# Patient Record
Sex: Female | Born: 1960
Health system: Southern US, Community
[De-identification: ages and names within clinical notes are randomized; demographics above are authoritative.]

---

## 1998-07-09 ENCOUNTER — Encounter: Payer: Self-pay | Admitting: Obstetrics and Gynecology

## 1998-07-09 ENCOUNTER — Ambulatory Visit (HOSPITAL_COMMUNITY): Admission: RE | Admit: 1998-07-09 | Discharge: 1998-07-09 | Payer: Self-pay | Admitting: Obstetrics and Gynecology

## 2008-02-20 ENCOUNTER — Encounter: Admission: RE | Admit: 2008-02-20 | Discharge: 2008-02-20 | Payer: Self-pay | Admitting: Obstetrics and Gynecology

## 2008-02-22 ENCOUNTER — Encounter (INDEPENDENT_AMBULATORY_CARE_PROVIDER_SITE_OTHER): Payer: Self-pay | Admitting: Diagnostic Radiology

## 2008-02-22 ENCOUNTER — Encounter: Admission: RE | Admit: 2008-02-22 | Discharge: 2008-02-22 | Payer: Self-pay | Admitting: Obstetrics and Gynecology

## 2008-02-28 ENCOUNTER — Encounter: Admission: RE | Admit: 2008-02-28 | Discharge: 2008-02-28 | Payer: Self-pay | Admitting: Obstetrics and Gynecology

## 2008-02-29 ENCOUNTER — Ambulatory Visit: Payer: Self-pay | Admitting: Oncology

## 2008-03-08 ENCOUNTER — Ambulatory Visit (HOSPITAL_COMMUNITY): Admission: RE | Admit: 2008-03-08 | Discharge: 2008-03-08 | Payer: Self-pay | Admitting: Oncology

## 2008-03-12 ENCOUNTER — Ambulatory Visit (HOSPITAL_COMMUNITY): Admission: RE | Admit: 2008-03-12 | Discharge: 2008-03-12 | Payer: Self-pay | Admitting: Oncology

## 2008-03-19 ENCOUNTER — Encounter: Admission: RE | Admit: 2008-03-19 | Discharge: 2008-03-19 | Payer: Self-pay | Admitting: Oncology

## 2009-03-08 IMAGING — CT CT ABDOMEN W/ CM
2 of 5 series · 16 of 46 positions shown, 18 images · IV contrast (agent unspecified)
Comparison: None.

CT CHEST

CLINICAL DATA: Aging new diagnosis of right breast cancer.  Chest
pain and cough.  Shortness breath.  Status post appendectomy.

CT CHEST, ABDOMEN AND PELVIS WITH CONTRAST
TECHNIQUE: Multidetector CT imaging of the chest, abdomen and
pelvis was performed following the standard protocol during bolus
administration of intravenous contrast.
Contrast: 100 ml Tmnipaque-VUU.

[Series 2: cap 5.0 b40f · axial · 0.62mm/px · z∈[-610,-50]mm · 13 of 128 slices shown, 15 images]
[im 8/128  soft-tissue]
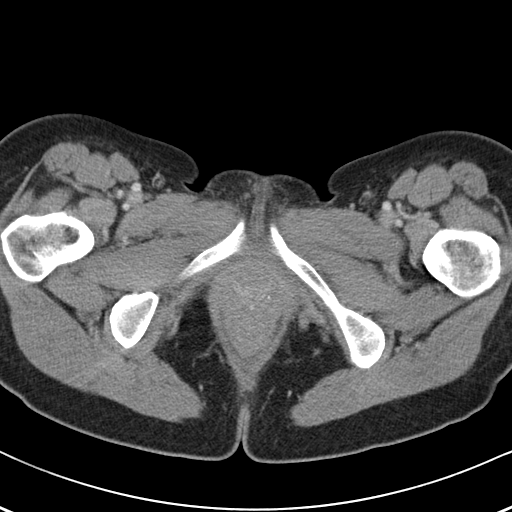
[im 8/128  bone]
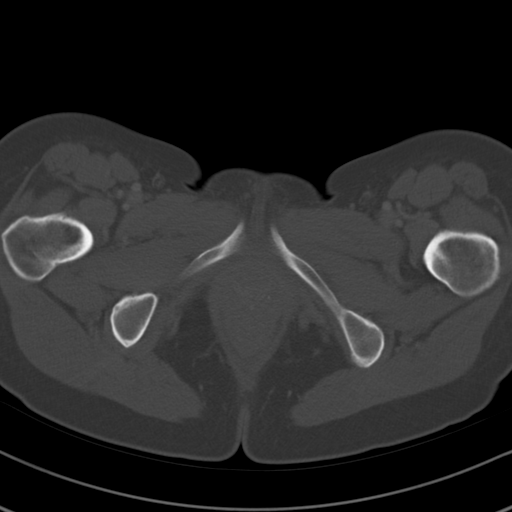
[im 15/128  soft-tissue]
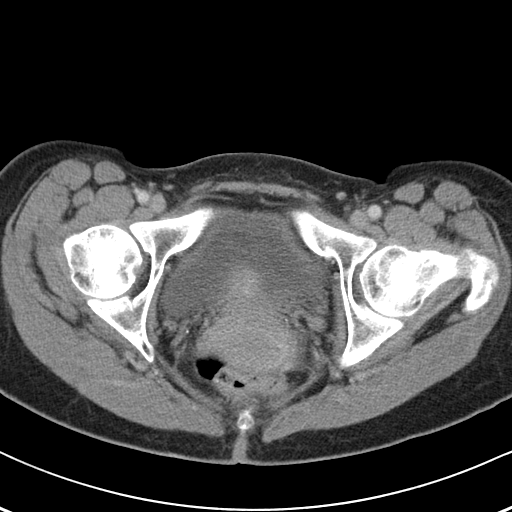
[im 30/128  soft-tissue]
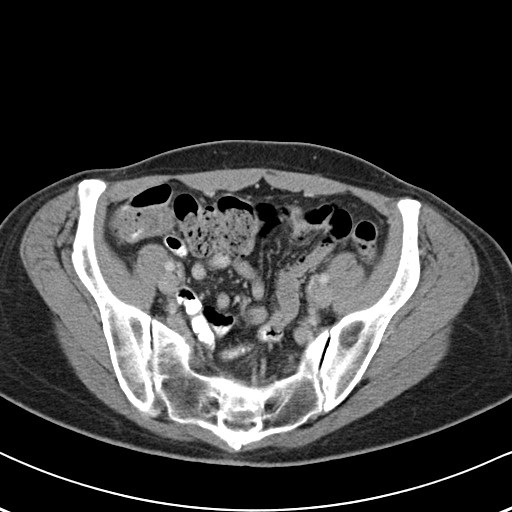
[im 38/128  soft-tissue]
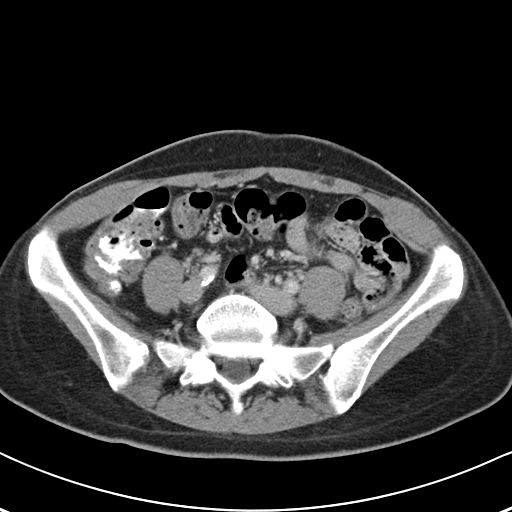
[im 45/128  soft-tissue]
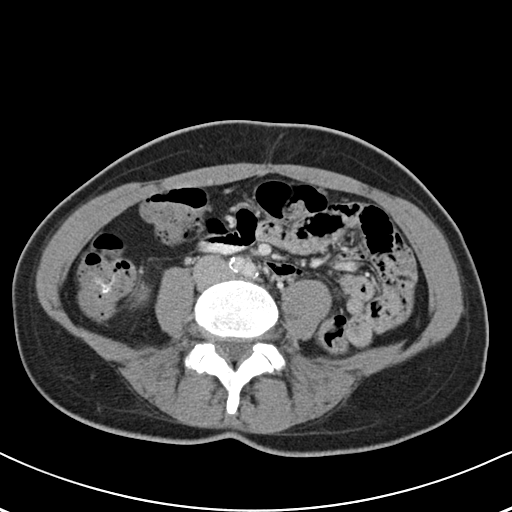
[im 53/128  soft-tissue]
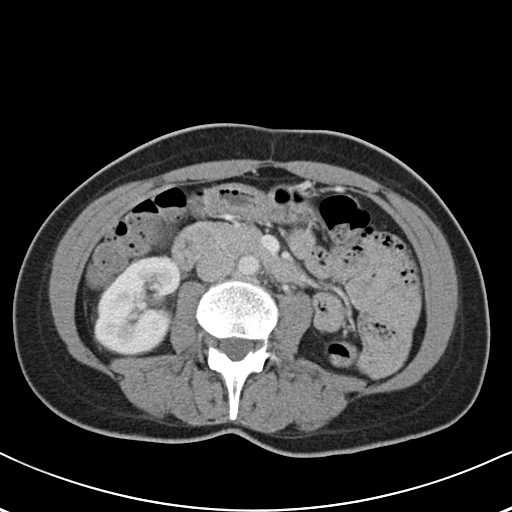
[im 68/128  soft-tissue]
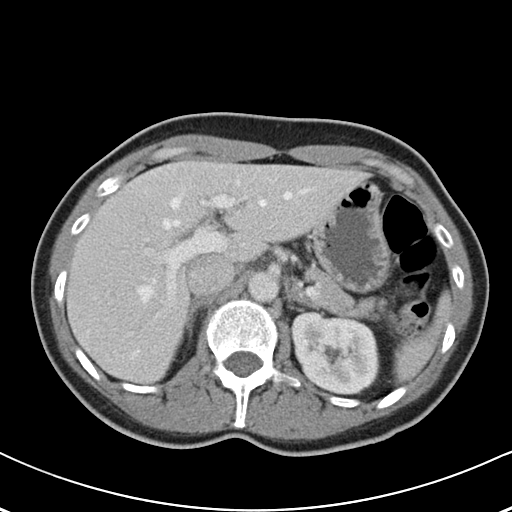
[im 75/128  soft-tissue]
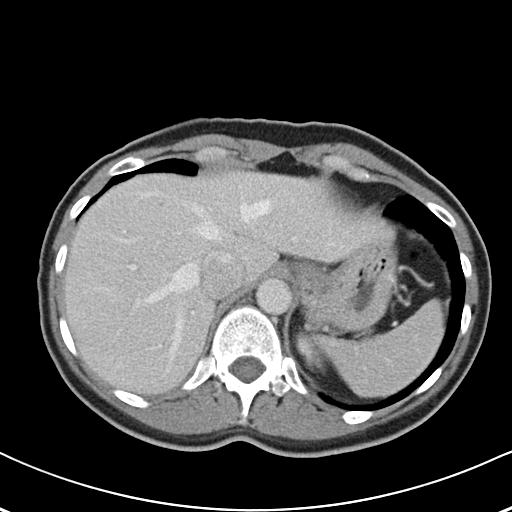
[im 83/128  soft-tissue]
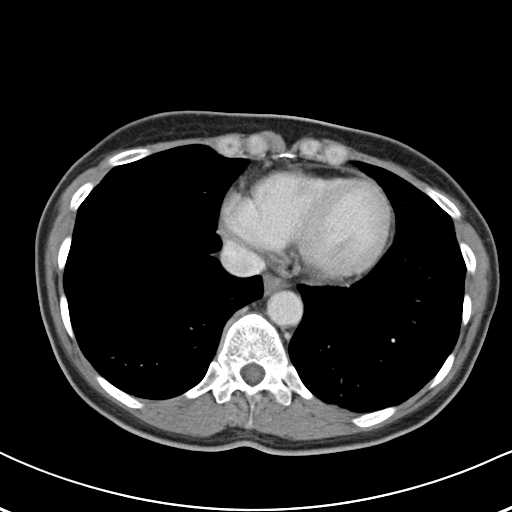
[im 83/128  bone]
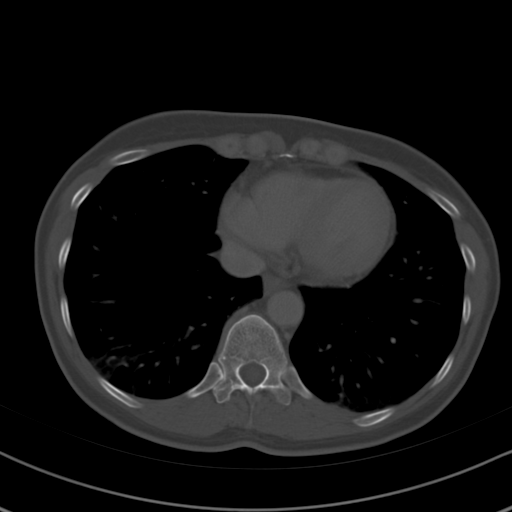
[im 90/128  soft-tissue]
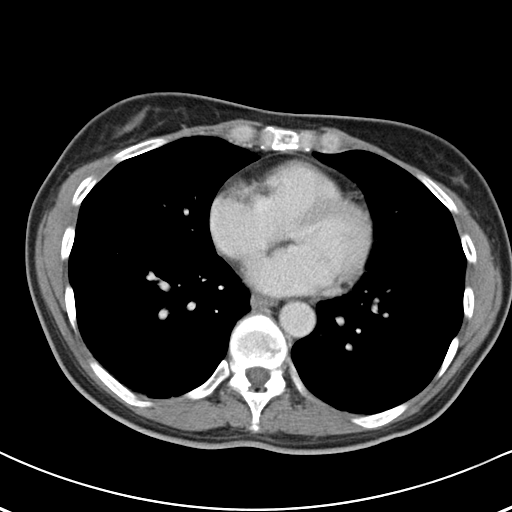
[im 98/128  soft-tissue]
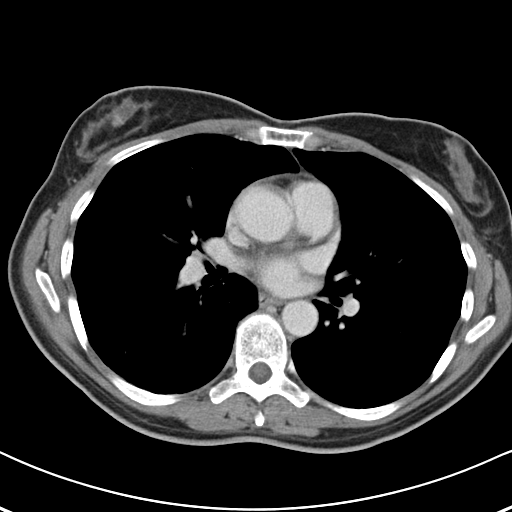
[im 113/128  soft-tissue]
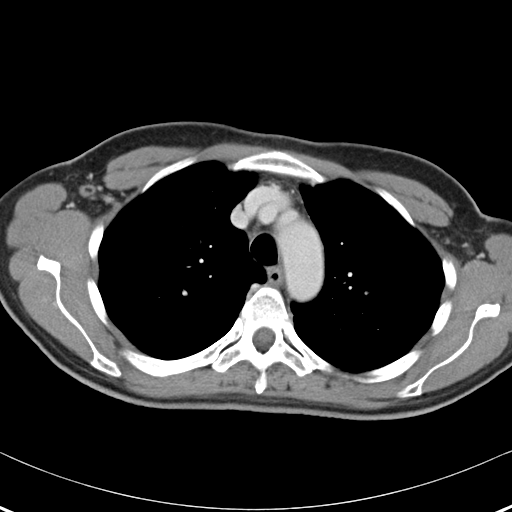
[im 120/128  soft-tissue]
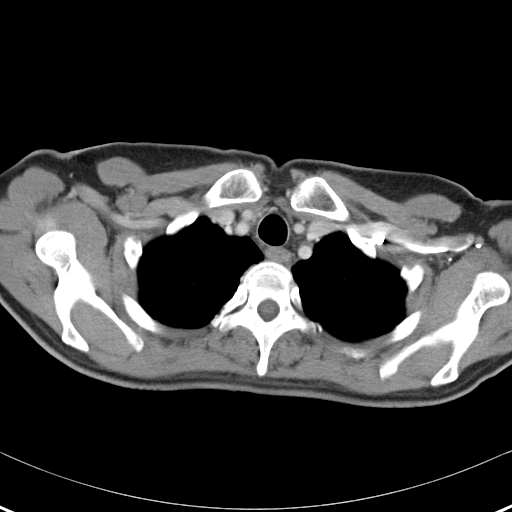

[Series 602: <mpr thick range> · coronal · 1.24mm/px · 3 of 72 slices shown]
[im 24/72  soft-tissue]
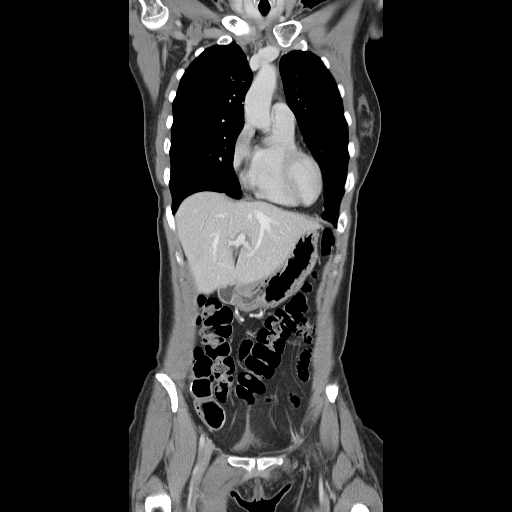
[im 32/72  soft-tissue]
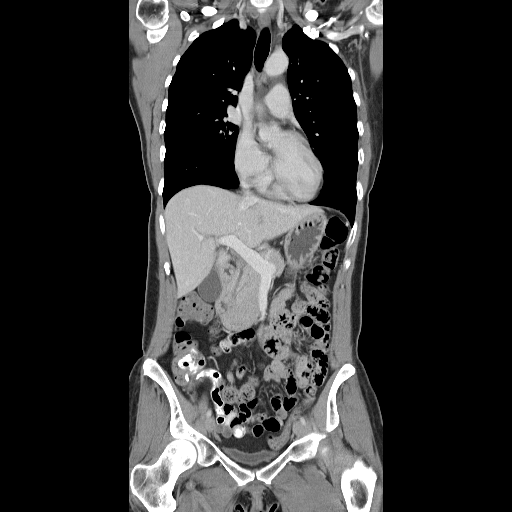
[im 40/72  soft-tissue]
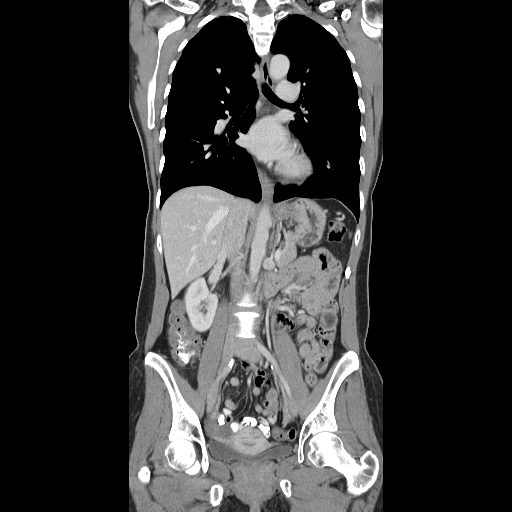

[16 of 46 positions shown; findings below may reference images not displayed]

FINDINGS: Bibasilar parenchymal changes have an appearance
suggestive of atelectasis.  Infectious infiltrate felt to be less
likely consideration. These areas can be further evaluated on
follow-up. Atelectatic changes inferior aspect right middle lobe.
Minimal apical pleural thickening right greater left without bony
destruction.  Tiny pleural based nodules right upper lobe (series 4
images 8 and 23), tiny opacity peripheral aspect right upper lobe
(series 4 image 29).  Attention to these regions on follow-up.

No pulmonary embolus.  Heart size within normal limits.  Dilated
ascending thoracic aorta measuring 3.5 x 3.5 cm as versus
descending aorta measuring 2.2 x 2.2 cm.  No aortic dissection.

No mediastinal, hilar or axillary adenopathy.  Right breast mass
measures up to 4 cm consistent with the patient's known malignancy.
Degenerative changes lower cervical thoracic spine No bony
destructive lesion.
IMPRESSION: 4 cm right breast mass consistent with the patient's known
malignancy.

No definitive evidence of pulmonary metastatic disease.  The tiny
nodules seen within the right upper lung  may be unrelated to
malignancy, stability can be confirmed on follow-up.

Bibasilar parenchymal opacities may represent atelectatic changes
rather than infiltrate.  Clinical correlation and follow-up
recommended if there are progressive symptoms such as cough or
chest pain.

Ascending thoracic aorta is dilated measuring up to 3.5 cm as noted
above.

CT ABDOMEN
FINDINGS: Within the liver, there are at least four low density
lesions measuring up to 4 mm best appreciated on sagittal imaging.
These are too small to adequately characterized as a small cysts
and therefore  small metastatic lesions cannot be completely
excluded.  Stability will need to be confirmed on follow-up.

Bilateral adrenal gland hyperplasia.  The enlarged adrenal glands
do not demonstrate abnormal F D G uptake on the accompanying PET
CT.

No focal splenic, renal or pancreatic lesion.  No calcified
gallstones.  Mild calcification and plaque formation lower
abdominal aorta without aneurysmal dilatation.  The no
retroperitoneal or pelvic adenopathy.  No abnormal inflammatory
process or focal bowel abnormality.  No bony destructive lesion.
IMPRESSION: Tiny liver lesions too small to adequately characterize as
discussed above.

Bilateral adrenal gland hyperplasia.

CT PELVIS
FINDINGS: Within the right aspect of pelvis, fluid containing
structure may represent small bowel however hydrosalpinx cannot be
excluded.  No pelvic adenopathy or findings to suggest metastatic
disease.
IMPRESSION: Question of right-sided hydrosalpinx as noted above.

No pelvic metastatic disease.

## 2011-04-14 ENCOUNTER — Other Ambulatory Visit: Payer: Self-pay | Admitting: Plastic Surgery

## 2011-05-15 LAB — GLUCOSE, CAPILLARY: Glucose-Capillary: 95

## 2011-09-29 ENCOUNTER — Other Ambulatory Visit: Payer: Self-pay | Admitting: Plastic Surgery

## 2020-03-16 ENCOUNTER — Other Ambulatory Visit: Payer: Self-pay

## 2020-03-16 ENCOUNTER — Encounter (HOSPITAL_COMMUNITY): Payer: Self-pay | Admitting: Radiology

## 2020-03-16 ENCOUNTER — Emergency Department (HOSPITAL_COMMUNITY): Payer: No Typology Code available for payment source

## 2020-03-16 ENCOUNTER — Inpatient Hospital Stay (HOSPITAL_COMMUNITY)
Admission: EM | Admit: 2020-03-16 | Discharge: 2020-03-19 | DRG: 024 | Disposition: A | Discharge status: Home / Self Care | Payer: No Typology Code available for payment source | Attending: Neurology | Admitting: Neurology

## 2020-03-16 ENCOUNTER — Encounter (HOSPITAL_COMMUNITY)
Admission: EM | Disposition: A | Discharge status: Home / Self Care | Payer: Self-pay | Source: Home / Self Care | Attending: Neurology

## 2020-03-16 ENCOUNTER — Inpatient Hospital Stay (HOSPITAL_COMMUNITY): Payer: No Typology Code available for payment source

## 2020-03-16 ENCOUNTER — Emergency Department (HOSPITAL_COMMUNITY): Payer: No Typology Code available for payment source | Admitting: Anesthesiology

## 2020-03-16 DIAGNOSIS — R471 Dysarthria and anarthria: Secondary | ICD-10-CM | POA: Diagnosis present

## 2020-03-16 DIAGNOSIS — Z91040 Latex allergy status: Secondary | ICD-10-CM

## 2020-03-16 DIAGNOSIS — I639 Cerebral infarction, unspecified: Secondary | ICD-10-CM | POA: Diagnosis present

## 2020-03-16 DIAGNOSIS — I63119 Cerebral infarction due to embolism of unspecified vertebral artery: Secondary | ICD-10-CM

## 2020-03-16 DIAGNOSIS — Q211 Atrial septal defect: Secondary | ICD-10-CM

## 2020-03-16 DIAGNOSIS — Z23 Encounter for immunization: Secondary | ICD-10-CM

## 2020-03-16 DIAGNOSIS — R29810 Facial weakness: Secondary | ICD-10-CM | POA: Diagnosis present

## 2020-03-16 DIAGNOSIS — R414 Neurologic neglect syndrome: Secondary | ICD-10-CM | POA: Diagnosis present

## 2020-03-16 DIAGNOSIS — Z885 Allergy status to narcotic agent status: Secondary | ICD-10-CM | POA: Diagnosis not present

## 2020-03-16 DIAGNOSIS — Z9011 Acquired absence of right breast and nipple: Secondary | ICD-10-CM | POA: Diagnosis not present

## 2020-03-16 DIAGNOSIS — Q2112 Patent foramen ovale: Secondary | ICD-10-CM

## 2020-03-16 DIAGNOSIS — R001 Bradycardia, unspecified: Secondary | ICD-10-CM | POA: Diagnosis not present

## 2020-03-16 DIAGNOSIS — I959 Hypotension, unspecified: Secondary | ICD-10-CM | POA: Diagnosis present

## 2020-03-16 DIAGNOSIS — I63411 Cerebral infarction due to embolism of right middle cerebral artery: Principal | ICD-10-CM | POA: Diagnosis present

## 2020-03-16 DIAGNOSIS — Z20822 Contact with and (suspected) exposure to covid-19: Secondary | ICD-10-CM | POA: Diagnosis present

## 2020-03-16 DIAGNOSIS — E785 Hyperlipidemia, unspecified: Secondary | ICD-10-CM | POA: Diagnosis present

## 2020-03-16 DIAGNOSIS — Z853 Personal history of malignant neoplasm of breast: Secondary | ICD-10-CM

## 2020-03-16 DIAGNOSIS — K047 Periapical abscess without sinus: Secondary | ICD-10-CM | POA: Diagnosis present

## 2020-03-16 DIAGNOSIS — Z8673 Personal history of transient ischemic attack (TIA), and cerebral infarction without residual deficits: Secondary | ICD-10-CM

## 2020-03-16 DIAGNOSIS — G8194 Hemiplegia, unspecified affecting left nondominant side: Secondary | ICD-10-CM | POA: Diagnosis present

## 2020-03-16 DIAGNOSIS — I6601 Occlusion and stenosis of right middle cerebral artery: Secondary | ICD-10-CM

## 2020-03-16 HISTORY — PX: RADIOLOGY WITH ANESTHESIA: SHX6223

## 2020-03-16 HISTORY — PX: IR PERCUTANEOUS ART THROMBECTOMY/INFUSION INTRACRANIAL INC DIAG ANGIO: IMG6087

## 2020-03-16 LAB — COMPREHENSIVE METABOLIC PANEL
ALT: 19 U/L (ref 0–44)
AST: 17 U/L (ref 15–41)
Albumin: 3.4 g/dL — ABNORMAL LOW (ref 3.5–5.0)
Alkaline Phosphatase: 59 U/L (ref 38–126)
Anion gap: 10 (ref 5–15)
BUN: 8 mg/dL (ref 6–20)
CO2: 24 mmol/L (ref 22–32)
Calcium: 9.4 mg/dL (ref 8.9–10.3)
Chloride: 106 mmol/L (ref 98–111)
Creatinine, Ser: 0.72 mg/dL (ref 0.44–1.00)
GFR calc Af Amer: >60 mL/min (ref >60)
GFR calc non Af Amer: >60 mL/min (ref >60)
Glucose, Bld: 94 mg/dL (ref 70–99)
Potassium: 4.1 mmol/L (ref 3.5–5.1)
Sodium: 140 mmol/L (ref 135–145)
Total Bilirubin: 0.7 mg/dL (ref 0.3–1.2)
Total Protein: 5.7 g/dL — ABNORMAL LOW (ref 6.5–8.1)

## 2020-03-16 LAB — I-STAT BETA HCG BLOOD, ED (MC, WL, AP ONLY): I-stat hCG, quantitative: <5 m[IU]/mL (ref <5)

## 2020-03-16 LAB — RESPIRATORY PANEL BY RT PCR (FLU A&B, COVID)
Influenza A by PCR: NEGATIVE
Influenza B by PCR: NEGATIVE
SARS Coronavirus 2 by RT PCR: NEGATIVE

## 2020-03-16 LAB — CBG MONITORING, ED: Glucose-Capillary: 88 mg/dL (ref 70–99)

## 2020-03-16 LAB — I-STAT CHEM 8, ED
BUN: 8 mg/dL (ref 6–20)
Calcium, Ion: 1.15 mmol/L (ref 1.15–1.40)
Chloride: 104 mmol/L (ref 98–111)
Creatinine, Ser: 0.6 mg/dL (ref 0.44–1.00)
Glucose, Bld: 89 mg/dL (ref 70–99)
HCT: 37 % (ref 36.0–46.0)
Hemoglobin: 12.6 g/dL (ref 12.0–15.0)
Potassium: 3.9 mmol/L (ref 3.5–5.1)
Sodium: 141 mmol/L (ref 135–145)
TCO2: 24 mmol/L (ref 22–32)

## 2020-03-16 LAB — CBC
HCT: 38.6 % (ref 36.0–46.0)
Hemoglobin: 12.6 g/dL (ref 12.0–15.0)
MCH: 31.6 pg (ref 26.0–34.0)
MCHC: 32.6 g/dL (ref 30.0–36.0)
MCV: 96.7 fL (ref 80.0–100.0)
Platelets: 244 10*3/uL (ref 150–400)
RBC: 3.99 MIL/uL (ref 3.87–5.11)
RDW: 12 % (ref 11.5–15.5)
WBC: 6.3 10*3/uL (ref 4.0–10.5)
nRBC: 0 % (ref 0.0–0.2)

## 2020-03-16 LAB — DIFFERENTIAL
Abs Immature Granulocytes: 0.01 10*3/uL (ref 0.00–0.07)
Basophils Absolute: 0.1 10*3/uL (ref 0.0–0.1)
Basophils Relative: 1 %
Eosinophils Absolute: 0.1 10*3/uL (ref 0.0–0.5)
Eosinophils Relative: 1 %
Immature Granulocytes: 0 %
Lymphocytes Relative: 33 %
Lymphs Abs: 2.1 10*3/uL (ref 0.7–4.0)
Monocytes Absolute: 0.4 10*3/uL (ref 0.1–1.0)
Monocytes Relative: 6 %
Neutro Abs: 3.7 10*3/uL (ref 1.7–7.7)
Neutrophils Relative %: 59 %

## 2020-03-16 LAB — MRSA PCR SCREENING: MRSA by PCR: NEGATIVE

## 2020-03-16 LAB — APTT: aPTT: 27 seconds (ref 24–36)

## 2020-03-16 LAB — ANTITHROMBIN III: AntiThromb III Func: 90 % (ref 75–120)

## 2020-03-16 LAB — PROTIME-INR
INR: 1 (ref 0.8–1.2)
Prothrombin Time: 12.6 seconds (ref 11.4–15.2)

## 2020-03-16 LAB — ETHANOL: Alcohol, Ethyl (B): <10 mg/dL (ref <10)

## 2020-03-16 LAB — HIV ANTIBODY (ROUTINE TESTING W REFLEX): HIV Screen 4th Generation wRfx: NONREACTIVE

## 2020-03-16 LAB — VITAMIN B12: Vitamin B-12: 382 pg/mL (ref 180–914)

## 2020-03-16 SURGERY — IR WITH ANESTHESIA
Anesthesia: General

## 2020-03-16 MED ORDER — SENNOSIDES-DOCUSATE SODIUM 8.6-50 MG PO TABS: 1.0000 | ORAL_TABLET | Freq: Every evening | ORAL | Status: DC | PRN

## 2020-03-16 MED ORDER — TIROFIBAN HCL IN NACL 5-0.9 MG/100ML-% IV SOLN
INTRAVENOUS | Status: AC
  Filled 2020-03-16: qty 100

## 2020-03-16 MED ORDER — SODIUM CHLORIDE 0.9 % IV SOLN: 50.0000 mL | Freq: Once | INTRAVENOUS | Status: DC

## 2020-03-16 MED ORDER — EPTIFIBATIDE 20 MG/10ML IV SOLN
INTRAVENOUS | Status: AC
  Filled 2020-03-16: qty 10

## 2020-03-16 MED ORDER — IOHEXOL 240 MG/ML SOLN
INTRAMUSCULAR | Status: AC
  Filled 2020-03-16: qty 200

## 2020-03-16 MED ORDER — SUCCINYLCHOLINE CHLORIDE 200 MG/10ML IV SOSY
PREFILLED_SYRINGE | INTRAVENOUS | Status: DC | PRN
  Administered 2020-03-16: 80 mg via INTRAVENOUS

## 2020-03-16 MED ORDER — ASPIRIN 81 MG PO CHEW
CHEWABLE_TABLET | ORAL | Status: AC
  Filled 2020-03-16: qty 1

## 2020-03-16 MED ORDER — IOHEXOL 300 MG/ML  SOLN
150.0000 mL | Freq: Once | INTRAMUSCULAR | Status: AC | PRN
  Administered 2020-03-16: 85 mL via INTRA_ARTERIAL

## 2020-03-16 MED ORDER — SODIUM CHLORIDE 0.9 % IV BOLUS
1000.0000 mL | Freq: Once | INTRAVENOUS | Status: AC
  Administered 2020-03-16: 1000 mL via INTRAVENOUS

## 2020-03-16 MED ORDER — SODIUM CHLORIDE 0.9 % IV SOLN: 250.0000 mL | INTRAVENOUS | Status: DC

## 2020-03-16 MED ORDER — PROPOFOL 10 MG/ML IV BOLUS
INTRAVENOUS | Status: DC | PRN
  Administered 2020-03-16: 150 mg via INTRAVENOUS

## 2020-03-16 MED ORDER — ACETAMINOPHEN 650 MG RE SUPP: 650.0000 mg | RECTAL | Status: DC | PRN

## 2020-03-16 MED ORDER — IOHEXOL 350 MG/ML SOLN
50.0000 mL | Freq: Once | INTRAVENOUS | Status: AC | PRN
  Administered 2020-03-16: 50 mL via INTRAVENOUS

## 2020-03-16 MED ORDER — ACETAMINOPHEN 160 MG/5ML PO SOLN: 650.0000 mg | ORAL | Status: DC | PRN

## 2020-03-16 MED ORDER — TICAGRELOR 90 MG PO TABS
ORAL_TABLET | ORAL | Status: AC
  Filled 2020-03-16: qty 2

## 2020-03-16 MED ORDER — NITROGLYCERIN 1 MG/10 ML FOR IR/CATH LAB
INTRA_ARTERIAL | Status: AC
  Filled 2020-03-16: qty 10

## 2020-03-16 MED ORDER — SODIUM CHLORIDE 0.9 % IV BOLUS: 1000.0000 mL | Freq: Once | INTRAVENOUS | Status: AC

## 2020-03-16 MED ORDER — PNEUMOCOCCAL VAC POLYVALENT 25 MCG/0.5ML IJ INJ: 0.5000 mL | INJECTION | INTRAMUSCULAR | Status: DC | PRN

## 2020-03-16 MED ORDER — PHENYLEPHRINE HCL-NACL 10-0.9 MG/250ML-% IV SOLN
INTRAVENOUS | Status: DC | PRN
  Administered 2020-03-16: 50 ug/min via INTRAVENOUS

## 2020-03-16 MED ORDER — ACETAMINOPHEN 325 MG PO TABS: 650.0000 mg | ORAL_TABLET | ORAL | Status: DC | PRN

## 2020-03-16 MED ORDER — SODIUM CHLORIDE 0.9 % IV SOLN: INTRAVENOUS | Status: DC

## 2020-03-16 MED ORDER — CEFAZOLIN SODIUM-DEXTROSE 2-3 GM-%(50ML) IV SOLR
INTRAVENOUS | Status: DC | PRN
  Administered 2020-03-16: 2 g via INTRAVENOUS

## 2020-03-16 MED ORDER — CHLORHEXIDINE GLUCONATE CLOTH 2 % EX PADS
6.0000 | MEDICATED_PAD | Freq: Every day | CUTANEOUS | Status: DC
  Administered 2020-03-16 – 2020-03-17 (×2): 6 via TOPICAL

## 2020-03-16 MED ORDER — CLOPIDOGREL BISULFATE 300 MG PO TABS
ORAL_TABLET | ORAL | Status: AC
  Filled 2020-03-16: qty 1

## 2020-03-16 MED ORDER — CLEVIDIPINE BUTYRATE 0.5 MG/ML IV EMUL: 0.0000 mg/h | INTRAVENOUS | Status: DC

## 2020-03-16 MED ORDER — FENTANYL CITRATE (PF) 100 MCG/2ML IJ SOLN
INTRAMUSCULAR | Status: DC | PRN
  Administered 2020-03-16: 50 ug via INTRAVENOUS

## 2020-03-16 MED ORDER — ACETAMINOPHEN 325 MG PO TABS
650.0000 mg | ORAL_TABLET | ORAL | Status: DC | PRN
  Administered 2020-03-18: 650 mg via ORAL
  Filled 2020-03-16: qty 2

## 2020-03-16 MED ORDER — FENTANYL CITRATE (PF) 250 MCG/5ML IJ SOLN
INTRAMUSCULAR | Status: AC
  Filled 2020-03-16: qty 5

## 2020-03-16 MED ORDER — CLEVIDIPINE BUTYRATE 0.5 MG/ML IV EMUL: 0.0000 mg/h | INTRAVENOUS | Status: AC

## 2020-03-16 MED ORDER — PANTOPRAZOLE SODIUM 40 MG IV SOLR
40.0000 mg | Freq: Every day | INTRAVENOUS | Status: DC
  Administered 2020-03-17 – 2020-03-18 (×3): 40 mg via INTRAVENOUS
  Filled 2020-03-16 (×3): qty 40

## 2020-03-16 MED ORDER — ALTEPLASE (STROKE) FULL DOSE INFUSION
0.9000 mg/kg | Freq: Once | INTRAVENOUS | Status: AC
  Administered 2020-03-16: 40.8 mg via INTRAVENOUS
  Filled 2020-03-16: qty 100

## 2020-03-16 MED ORDER — STROKE: EARLY STAGES OF RECOVERY BOOK
Freq: Once | Status: DC
  Filled 2020-03-16: qty 1

## 2020-03-16 MED ORDER — CEFAZOLIN SODIUM-DEXTROSE 2-4 GM/100ML-% IV SOLN
INTRAVENOUS | Status: AC
  Filled 2020-03-16: qty 100

## 2020-03-16 MED ORDER — ONDANSETRON HCL 4 MG/2ML IJ SOLN
INTRAMUSCULAR | Status: DC | PRN
  Administered 2020-03-16: 4 mg via INTRAVENOUS

## 2020-03-16 MED ORDER — PHENYLEPHRINE HCL-NACL 10-0.9 MG/250ML-% IV SOLN
25.0000 ug/min | INTRAVENOUS | Status: DC
  Administered 2020-03-16: 50 ug/min via INTRAVENOUS
  Administered 2020-03-16: 200 ug/min via INTRAVENOUS
  Administered 2020-03-16: 65 ug/min via INTRAVENOUS
  Administered 2020-03-17 (×2): 100 ug/min via INTRAVENOUS
  Administered 2020-03-17: 50 ug/min via INTRAVENOUS
  Administered 2020-03-17: 30 ug/min via INTRAVENOUS
  Administered 2020-03-17 (×2): 100 ug/min via INTRAVENOUS
  Administered 2020-03-17: 30 ug/min via INTRAVENOUS
  Administered 2020-03-18: 25 ug/min via INTRAVENOUS
  Filled 2020-03-16: qty 500
  Filled 2020-03-16 (×3): qty 250
  Filled 2020-03-16 (×2): qty 500
  Filled 2020-03-16: qty 250

## 2020-03-16 MED ORDER — SODIUM CHLORIDE (PF) 0.9 % IJ SOLN
INTRAVENOUS | Status: AC | PRN
  Administered 2020-03-16: 25 ug via INTRA_ARTERIAL

## 2020-03-16 MED ORDER — ROCURONIUM BROMIDE 10 MG/ML (PF) SYRINGE
PREFILLED_SYRINGE | INTRAVENOUS | Status: DC | PRN
  Administered 2020-03-16: 10 mg via INTRAVENOUS
  Administered 2020-03-16: 30 mg via INTRAVENOUS

## 2020-03-16 MED ORDER — LIDOCAINE 2% (20 MG/ML) 5 ML SYRINGE
INTRAMUSCULAR | Status: DC | PRN
  Administered 2020-03-16: 60 mg via INTRAVENOUS

## 2020-03-16 MED ORDER — SODIUM CHLORIDE 0.9 % IV SOLN
INTRAVENOUS | Status: DC | PRN
Start: 2020-03-16 — End: 2020-03-16

## 2020-03-16 MED ORDER — VERAPAMIL HCL 2.5 MG/ML IV SOLN
INTRAVENOUS | Status: AC
  Filled 2020-03-16: qty 2

## 2020-03-16 MED ORDER — SUGAMMADEX SODIUM 200 MG/2ML IV SOLN
INTRAVENOUS | Status: DC | PRN
  Administered 2020-03-16: 200 mg via INTRAVENOUS

## 2020-03-16 NOTE — Anesthesia Postprocedure Evaluation (Signed)
Anesthesia Post Note  Patient: Chelsea Trevino  Procedure(s) Performed: IR WITH ANESTHESIA (N/A )     Patient location during evaluation: PACU Anesthesia Type: General Level of consciousness: awake and alert Pain management: pain level controlled Vital Signs Assessment: post-procedure vital signs reviewed and stable Respiratory status: spontaneous breathing, nonlabored ventilation, respiratory function stable and patient connected to nasal cannula oxygen Cardiovascular status: blood pressure returned to baseline and stable Postop Assessment: no apparent nausea or vomiting Anesthetic complications: no   No complications documented.  Last Vitals:  Vitals:   03/16/20 2045 03/16/20 2100  BP: 124/70 119/65  Pulse: (!) 44 (!) 43  Resp: 16 14  Temp:    SpO2: 95% 95%    Last Pain:  Vitals:   03/16/20 2000  TempSrc: Oral  PainSc:                  [name]

## 2020-03-16 NOTE — ED Notes (Signed)
Pts boss, Corrie Dandy, would like to be updated if pt consents [phone].

## 2020-03-16 NOTE — ED Notes (Signed)
Hand off report given to IR staff, care transitioned

## 2020-03-16 NOTE — ED Triage Notes (Signed)
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 Also, L sided facial droop, slurred speech. EMS arrived, activated code stroke, 110/70BP, 77HR, CBG 110. ED CBG 88.   H/o BCA with biopsy, denies blood thinners.

## 2020-03-16 NOTE — [facility] Notes (Signed)
Patient ID: Chelsea Trevino, female   DOB: [birth year], 59 y.o.   MRN: [id number]. INR 59 year old right-handed lady last seen normal at 11:30 AM today.  New onset of left-sided weakness left-sided, left-sided sensory symptoms,, and neglect on the left side.    MRSS 0.  CT of the brain demonstrates hyperdense right middle several artery sign.  Aspects 10.  No intracranial hemorrhage seen.  CT angiogram demonstrates occlusion of the right noticeable artery proximally, and possible clot in the origin of the right anterior cerebral A1 segment.  An emergent two-physician consent was obtained in view of the patient being unable to provide informed consent due to her medical condition..  Also no immediatel available family members available physically or by phone.  S.Opha Mcghee MD

## 2020-03-16 NOTE — [facility] Notes (Signed)
Unable to assess NIH - patient under Anesthesia care intubated and sedated

## 2020-03-16 NOTE — H&P (Signed)
Admission H&P    Chief Complaint: Acute onset of staggering gait  HPI: Chelsea Trevino is an 59 y.o. female who presented to the ED via EMS for assessment of acute onset left sided weakness. LKN was 1130 when she was out shopping. She states that after leaving a store, she started to feel dizzy and then tried to walk back in, but noticed that her gait was unsteady and staggering. Bystanders helped her to sit down, then noted that she was unable to stay sitting up due to left sided weakness. She also had left sided facial droop and slurred speech.  EMS was called to the scene, where they noted severe left sided weakness, dysarthria and left facial droop. A Code Stroke was called and she was emergently transported to the [facility] ED. Vitals per EMS: BP 110/70, HR 77, CBG 102.   The patient was unaware of her weakness on arrival. She endorses feeling dizzy when she was found staggering at the store, but is not able to specify what type of dizziness it was. The patient denies any neurological symptoms, including no headache, no weakness (she states this while clearly being plegic on the left), no sensory loss, no trouble talking/understanding speech, no paresthesias and no vision changes. She states that she has a PMHx of breast CA treated about 10 years ago, as well as a cyst "on the roof of my mouth" which was recently excised.   STAT CT head was obtained, revealing a dense right MCA sign, but no other acute abnormality.   CT head: 1. Brain appears normal at this time. However, there is definite long segment hyperdense right MCA consistent with embolic occlusion. 2. ASPECTS is 10  CTA of head and neck:  Embolic occlusion at the right carotid terminus. Right MCA occlusion. Severe stenosis of the right A1 segment but with distal flow. Markedly diminished flow evident throughout the right middle cerebral artery territory. No evidence of hemorrhage or swelling at this point in time. Carotid bifurcations  widely patent.  LSN: [id number] tPA Given: Yes  No past medical history on file.  No family history on file. Social History:  has no history on file for tobacco use, alcohol use, and drug use.  Allergies: Not on File  (Not in a hospital admission)   ROS: As per HPI. In the context of her anosognosia, comprehensive ROS is otherwise negative.   Physical Examination: There were no vitals taken for this visit.   HEENT-  [place]/AT  Heart: RRR Lungs - Respirations unlabored Abdomen: Nondistended Extremities - No edema  Neurologic Examination: Ment: Awake with mildly decreased level of alertness. Speech dysarthric but fluent with intact comprehension for basic questions and commands. Oriented to city, state, year, month and day.  CN: PERRL. Does not react to left sided visual stimuli. Rightward gaze deviation; patient unable to volitionally cross to the left, but can be overcome with oculocephalic maneuver. Does not react to left side facial stimuli. Left facial droop is prominent. Hearing intact to voice. Hypophonic speech. Head preferentially rotated to the right. Tongue protrudes slightly to the left of midline  Motor: 5/5 RUE and RLE. 0/5 LUE. 3/5 LLE which elevates to command after a significant delay.  Sensory: Absent FT, temp and pressure sensation to LUE. Decreased but present sensation to LLE. Sensation intact to RUE and RLE.  Reflexes: 2+ right biceps and brachioradialis. 1+ left biceps and brachioradialis. 2+ bilateral patellae Toes downgoing bilaterally.  Cerebellar: No ataxia with FNF on the right. Unable to  perform on the left.  Gait: Unable to assess.   Results for orders placed or performed during the hospital encounter of 03/16/20 (from the past 48 hour(s))  CBG monitoring, ED     Status: None   Collection Time: 03/16/20 12:08 PM  Result Value Ref Range   Glucose-Capillary 88 70 - 99 mg/dL    Comment: Glucose reference range applies only to samples taken after fasting for at  least 8 hours.   Comment 1 Notify RN   I-stat chem 8, ED     Status: None   Collection Time: 03/16/20 12:13 PM  Result Value Ref Range   Sodium 141 135 - 145 mmol/L   Potassium 3.9 3.5 - 5.1 mmol/L   Chloride 104 98 - 111 mmol/L   BUN 8 6 - 20 mg/dL   Creatinine, Ser 4.09 0.44 - 1.00 mg/dL   Glucose, Bld 89 70 - 99 mg/dL    Comment: Glucose reference range applies only to samples taken after fasting for at least 8 hours.   Calcium, Ion 1.15 1.15 - 1.40 mmol/L   TCO2 24 22 - 32 mmol/L   Hemoglobin 12.6 12.0 - 15.0 g/dL   HCT 81.1 36 - 46 %     Assessment: 59 y.o. female presenting with acute onset of left sided weakness, left facial droop, dysarthria and anosognosia.  1. Exam reveals findings referable to probable large right cerebral hemispheric stroke 2. STAT CT head: No acute hemorrhage or hypodensity. Hyperdense right MCA sign is noted.  3. CTA of head and neck: Embolic occlusion at the right carotid terminus. Right MCA occlusion. Severe stenosis of the right A1 segment but with distal flow. Markedly diminished flow evident throughout the right middle cerebral artery territory. Carotid bifurcations widely patent.. 4. Stroke Risk Factors - None 5. After comprehensive review of possible contraindications, she has no absolute contraindications to tPA administration. Patient is a tPA candidate. Discussed extensively the risks/benefits of tPA treatment vs. no treatment with the patient, including risks of hemorrhage and death with tPA administration versus worse overall outcomes on average in patients within tPA time window who are not administered tPA. Overall benefits of tPA regarding long-term prognosis are felt to outweigh risks. The patient expressed understanding and wish to proceed with tPA. tPA infusion started, without significant improvement in the patient's neurological exam.  6. The patient is a VIR candidate. Risks/benefits of the procedure were discussed extensively with patient,  including approximately 50% chance of significant improvement relative to an approximate 10% chance of subarachnoid hemorrhage with possibility of significant worsening including death. The patient expressed understanding and provided informed consent to proceed with VIR. Given her anosognosia, also deemed necessary was a two-physician emergency consent, which was obtained after discussion with Dr. Corliss Skains. Consent form signed.   Plan: 1. Admitting to Neuro ICU under the Neurology service.  2. Post-tPA and VIR order set to include frequent neuro checks and BP management.  3. No antiplatelet medications or anticoagulants for at least 24 hours following tPA and VIR.   4. DVT prophylaxis with SCDs.  5. Will need to be started on a statin.  6. Will need to be started on antiplatelet therapy if follow up CT at 24 hours is negative for hemorrhagic conversion. 7. TTE.  8. MRI brain 9. Telemetry monitoring 10. PT/OT/Speech.  11. NPO until passes swallow evaluation.  12. Fasting lipid panel, HgbA1c  65 minutes spent in the emergent neurological evaluation and management of this critically ill patient.  Electronically signed: Dr. Caryl Pina 03/16/2020, 12:18 PM

## 2020-03-16 NOTE — ED Notes (Signed)
Chelsea Trevino would like update in patient gives permission

## 2020-03-16 NOTE — ED Provider Notes (Signed)
[facility] EMERGENCY DEPARTMENT Provider Note   CSN: [id number] Arrival date & time: 03/16/20  1205     History No chief complaint on file.   Chelsea Trevino is a 59 y.o. female.  HPI She presents BMS for evaluation of stumbling, and weakness.  She was at a store, when EMS was summoned because of this problem.  Patient is able to give some history, denies headache, weakness, dizziness at this time.  On arrival she was seen as a code stroke patient, on the EMS gurney.  She is conversant and responsive.  Level 5 caveat-high acuity    No past medical history on file.  There are no problems to display for this patient.      OB History   No obstetric history on file.     No family history on file.  Social History   Tobacco Use  . Smoking status: Not on file  Substance Use Topics  . Alcohol use: Not on file  . Drug use: Not on file    Home Medications Prior to Admission medications   Not on File    Allergies    Patient has no allergy information on record.  Review of Systems   Review of Systems  Unable to perform ROS: Acuity of condition    Physical Exam Updated Vital Signs There were no vitals taken for this visit.  Physical Exam Vitals and nursing note reviewed.  Constitutional:      General: She is in acute distress.     Appearance: She is well-developed. She is not ill-appearing, toxic-appearing or diaphoretic.  HENT:     Head: Normocephalic and atraumatic.  Eyes:     Conjunctiva/sclera: Conjunctivae normal.     Pupils: Pupils are equal, round, and reactive to light.  Neck:     Trachea: Phonation normal.  Cardiovascular:     Rate and Rhythm: Normal rate.  Pulmonary:     Effort: Pulmonary effort is normal.  Abdominal:     General: There is no distension.  Musculoskeletal:        General: Normal range of motion.     Cervical back: Normal range of motion and neck supple.  Skin:    General: Skin is warm and dry.   Neurological:     Mental Status: She is alert.     Motor: No abnormal muscle tone.     Comments: Dysarthric, left facial droop, flaccid left arm.    Psychiatric:        Mood and Affect: Mood normal.        Behavior: Behavior normal.     ED Results / Procedures / Treatments   Labs (all labs ordered are listed, but only abnormal results are displayed) Labs Reviewed  ETHANOL  PROTIME-INR  APTT  CBC  DIFFERENTIAL  COMPREHENSIVE METABOLIC PANEL  RAPID URINE DRUG SCREEN, HOSP PERFORMED  URINALYSIS, ROUTINE W REFLEX MICROSCOPIC  I-STAT CHEM 8, ED  I-STAT BETA HCG BLOOD, ED ([facility], [facility], [facility] ONLY)  CBG MONITORING, ED    EKG None  Radiology No results found.  Procedures Procedures (including critical care time)  Medications Ordered in ED Medications - No data to display  ED Course  I have reviewed the triage vital signs and the nursing notes.  Pertinent labs & imaging results that were available during my care of the patient were reviewed by me and considered in my medical decision making (see chart for details).    MDM Rules/Calculators/A&P  No data found.    Medical Decision Making:  This patient is presenting for evaluation of possible stroke, which does require a range of treatment options, and is a complaint that involves a high risk of morbidity and mortality. The differential diagnoses include CVA, seizure, brain tumor. I decided to review old records, and in summary middle-aged female presenting with acute neurologic symptoms, concerning for stroke.  I did not require additional historical information from anyone.  Patient was seen by me, as screening for code stroke/airway.  Airway cleared by me at nurses station.  Care assumed by neuro hospitalist for management of acute stroke syndrome.  Critical Interventions-clinical evaluation  After These Interventions, the Patient was reevaluated and was found with likely stroke requiring  hospital neurologist services.  CRITICAL CARE-no Performed by: Mancel Bale  Nursing Notes Reviewed/ Care Coordinated Applicable Imaging Reviewed Interpretation of Laboratory Data incorporated into ED treatment  Plan as per neuro hospitalist    Final Clinical Impression(s) / ED Diagnoses Final diagnoses:  Stroke [facility])    Rx / DC Orders ED Discharge Orders    None       Mancel Bale, MD 03/16/20 2102

## 2020-03-16 NOTE — [facility] Notes (Signed)
PHARMACIST CODE STROKE RESPONSE  Notified to mix tPA at 1223 by Dr. Otelia Limes Delivered tPA to RN at 1226  tPA dose = 4.1mg  bolus over 1 minute followed by 36.7 mg for a total dose of 40.8mg  over 1 hour  Issues/delays encountered (if applicable):   Joaquim Lai PharmD. BCPS 03/16/20 12:27 PM

## 2020-03-16 NOTE — Procedures (Addendum)
S/P RT common carotid artrriogram. RT CFA approach. Findings. 1.Complete revascularization of occlded RT MCA prox M1 with x1 pass with 40 mm solitaire X retrieve and contact aspiration and prox flow arrest with TICI 3 revascularization. Left intubated. RT groin hemostasis with an 8 F angioseal closure device . Distal pulses  Palpable DPs and PTS bilaterally.CT brain early hyoattenation in the Rt putamen and the subcortical parietal region. Patient extubated in the angio suite after a CT of the brain.  Patient maintaining her oxygen concentrations.  Is readily arousable alert and oriented to place.  Pupils are 2 mm right equals left.  No discernible facial asymmetry.  Tongue in the midline.  Moves all 4  extremities nearly equally to command..  S.DEvehwar MD

## 2020-03-16 NOTE — Progress Notes (Signed)
Unable to assess NIH - patient under anesthesia care intubated and sedated

## 2020-03-16 NOTE — [facility] Notes (Signed)
Unable to assess NIH - patient under anesthesia care intubated and sedated 

## 2020-03-16 NOTE — Transfer of Care (Signed)
Immediate Anesthesia Transfer of Care Note  Patient: Chelsea Trevino  Procedure(s) Performed: IR WITH ANESTHESIA (N/A )  Patient Location: PACU  Anesthesia Type:General  Level of Consciousness: awake, alert  and oriented  Airway & Oxygen Therapy: Patient Spontanous Breathing and Patient connected to nasal cannula oxygen  Post-op Assessment: Report given to RN and Post -op Vital signs reviewed and stable  Post vital signs: Reviewed and stable  Last Vitals:  Vitals Value Taken Time  BP 114/70 03/16/20 1457  Temp    Pulse 71 03/16/20 1458  Resp 22 03/16/20 1458  SpO2 97 % 03/16/20 1458  Vitals shown include unvalidated device data.  Last Pain:  Vitals:   03/16/20 1310  PainSc: 0-No pain         Complications: No complications documented.

## 2020-03-16 NOTE — [facility] Notes (Signed)
1416 - patient left IR to go to CT 1418 - patient arrived to CT1 1427 - patient left CT to go to IR 1430 - patient arrived back in IR

## 2020-03-16 NOTE — Progress Notes (Signed)
Anesthesia present for case 

## 2020-03-16 NOTE — Progress Notes (Addendum)
The patient's sister Aleda Grana) can be reached at [phone].  The patient's sister and mother were contacted by telephone. The mother Georgie Chard was treated at [facility] for multiple strokes last year. She had PE at that time and had been told that she had an abnormal lab value that predisposed to blood clots. A hypercoagulable panel has been ordered for the patient based on this information.   The patient's mother has provided verbal consent for the medical team to access her chart to find the abnormal laboratory value(s) that she states pointed towards a predisposition for blood clots. On review of the mother's chart, her homocysteine level was elevated at 83.9 and she was started on Eliquis. Her B12 was markedly low at 91 and she was placed on B12 replacement.   Electronically signed: Dr. Caryl Pina

## 2020-03-16 NOTE — Progress Notes (Signed)
Patient under the care of Anesthesia- unable to assess NIH

## 2020-03-16 NOTE — ED Notes (Signed)
Pt transported to IR intubation bay 8

## 2020-03-16 NOTE — Progress Notes (Signed)
Unable to assess NIH,pt under the care of anesthesia, intubated and sedated

## 2020-03-16 NOTE — Anesthesia Preprocedure Evaluation (Signed)
Anesthesia Evaluation  Patient identified by MRN, date of birth, ID band Patient awake  Preop documentation limited or incomplete due to emergent nature of procedure.  Airway Mallampati: II  TM Distance: >3 FB Neck ROM: Full    Dental  (+) Dental Advisory Given   Pulmonary    breath sounds clear to auscultation       Cardiovascular  Rhythm:Regular Rate:Normal     Neuro/Psych    GI/Hepatic   Endo/Other    Renal/GU      Musculoskeletal   Abdominal   Peds  Hematology   Anesthesia Other Findings   Reproductive/Obstetrics                             Lab Results  Component Value Date   WBC 6.3 03/16/2020   HGB 12.6 03/16/2020   HCT 37.0 03/16/2020   MCV 96.7 03/16/2020   PLT 244 03/16/2020   Lab Results  Component Value Date   CREATININE 0.60 03/16/2020   BUN 8 03/16/2020   NA 141 03/16/2020   K 3.9 03/16/2020   CL 104 03/16/2020   CO2 24 03/16/2020    Anesthesia Physical Anesthesia Plan  ASA: IV and emergent  Anesthesia Plan: General   Post-op Pain Management:    Induction: Intravenous and Rapid sequence  PONV Risk Score and Plan: 3 and Dexamethasone, Ondansetron and Treatment may vary due to age or medical condition  Airway Management Planned: Oral ETT  Additional Equipment: Arterial line  Intra-op Plan:   Post-operative Plan: Possible Post-op intubation/ventilation  Informed Consent: I have reviewed the patients History and Physical, chart, labs and discussed the procedure including the risks, benefits and alternatives for the proposed anesthesia with the patient or authorized representative who has indicated his/her understanding and acceptance.     Dental advisory given  Plan Discussed with: CRNA  Anesthesia Plan Comments:         Anesthesia Quick Evaluation

## 2020-03-17 ENCOUNTER — Inpatient Hospital Stay (HOSPITAL_COMMUNITY): Payer: No Typology Code available for payment source

## 2020-03-17 DIAGNOSIS — I340 Nonrheumatic mitral (valve) insufficiency: Secondary | ICD-10-CM

## 2020-03-17 DIAGNOSIS — I361 Nonrheumatic tricuspid (valve) insufficiency: Secondary | ICD-10-CM

## 2020-03-17 LAB — CBC WITH DIFFERENTIAL/PLATELET
Abs Immature Granulocytes: 0.02 10*3/uL (ref 0.00–0.07)
Basophils Absolute: 0 10*3/uL (ref 0.0–0.1)
Basophils Relative: 0 %
Eosinophils Absolute: 0 10*3/uL (ref 0.0–0.5)
Eosinophils Relative: 0 %
HCT: 36.5 % (ref 36.0–46.0)
Hemoglobin: 12 g/dL (ref 12.0–15.0)
Immature Granulocytes: 0 %
Lymphocytes Relative: 10 %
Lymphs Abs: 1 10*3/uL (ref 0.7–4.0)
MCH: 32.3 pg (ref 26.0–34.0)
MCHC: 32.9 g/dL (ref 30.0–36.0)
MCV: 98.4 fL (ref 80.0–100.0)
Monocytes Absolute: 0.4 10*3/uL (ref 0.1–1.0)
Monocytes Relative: 4 %
Neutro Abs: 7.8 10*3/uL — ABNORMAL HIGH (ref 1.7–7.7)
Neutrophils Relative %: 86 %
Platelets: 252 10*3/uL (ref 150–400)
RBC: 3.71 MIL/uL — ABNORMAL LOW (ref 3.87–5.11)
RDW: 12.2 % (ref 11.5–15.5)
WBC: 9.2 10*3/uL (ref 4.0–10.5)
nRBC: 0 % (ref 0.0–0.2)

## 2020-03-17 LAB — BASIC METABOLIC PANEL
Anion gap: 8 (ref 5–15)
BUN: 7 mg/dL (ref 6–20)
CO2: 22 mmol/L (ref 22–32)
Calcium: 8.4 mg/dL — ABNORMAL LOW (ref 8.9–10.3)
Chloride: 111 mmol/L (ref 98–111)
Creatinine, Ser: 0.66 mg/dL (ref 0.44–1.00)
GFR calc Af Amer: >60 mL/min (ref >60)
GFR calc non Af Amer: >60 mL/min (ref >60)
Glucose, Bld: 139 mg/dL — ABNORMAL HIGH (ref 70–99)
Potassium: 4.1 mmol/L (ref 3.5–5.1)
Sodium: 141 mmol/L (ref 135–145)

## 2020-03-17 LAB — ECHOCARDIOGRAM COMPLETE
Area-P 1/2: 1.86 cm2
Calc EF: 66.6 %
Height: 64 in
S' Lateral: 2.7 cm
Single Plane A2C EF: 69.4 %
Single Plane A4C EF: 63.7 %
Weight: 1597.89 oz

## 2020-03-17 LAB — LIPID PANEL
Cholesterol: 205 mg/dL — ABNORMAL HIGH (ref 0–200)
HDL: 56 mg/dL (ref >40)
LDL Cholesterol: 138 mg/dL — ABNORMAL HIGH (ref 0–99)
Total CHOL/HDL Ratio: 3.7 RATIO
Triglycerides: 54 mg/dL (ref <150)
VLDL: 11 mg/dL (ref 0–40)

## 2020-03-17 LAB — HEMOGLOBIN A1C
Hgb A1c MFr Bld: 5.7 % — ABNORMAL HIGH (ref 4.8–5.6)
Mean Plasma Glucose: 116.89 mg/dL

## 2020-03-17 MED ORDER — ATORVASTATIN CALCIUM 80 MG PO TABS
80.0000 mg | ORAL_TABLET | Freq: Every day | ORAL | Status: DC
  Administered 2020-03-18 – 2020-03-19 (×2): 80 mg via ORAL
  Filled 2020-03-17 (×3): qty 1

## 2020-03-17 NOTE — CV Procedure (Signed)
2D echo attempted, but patient in chair eating. Will try later.

## 2020-03-17 NOTE — Progress Notes (Signed)
59 y.o. female inpatient. 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 Patient presented to ED at [facility] with a new onset of left sided weakness, left sided sensory deficit and left sided neglect. CT heads showed an occlusion of the right artery proximally to the right anterior cerebral AL segment. IR performed a revascularization of the right MCA proximal to M1 on 7.31.21.   Patient in MRI during first attempt for evaluation. Patient ambulating during second attempt. Spoke to bedside RN who check the right groin prior to ambulation and states it is a zero. RN reports minor left upper extremity sensory deficit and minor left sided facial droop. Per note from Dr. Otelia Limes dated 7.31.21 patient will be placed on eliquis for her homocysteine levels.  IR to continue to follow.

## 2020-03-17 NOTE — Evaluation (Signed)
Occupational Therapy Evaluation Patient Details Name: Chelsea Trevino MRN: [id number] DOB: 1961-06-20 Today's Date: 03/17/2020    History of Present Illness 59 y.o. female who presented to the ED via EMS for assessment of acute onset left sided weakness. LKN was 1130 when she was out shopping. She states that after leaving a store, she started to feel dizzy and then tried to walk back in, but noticed that her gait was unsteady and staggering. Bystanders helped her to sit down, then noted that she was unable to stay sitting up due to left sided weakness. She also had left sided facial droop and slurred speech. CT head demonstrating R MCA embolic occlusion. Pt received IV tPA and underwent angiogram and revascularization in IR on 7/31.   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 Pt currently with no focal deficits. Cognition tested with math problems, sequencing and safety- pt at baseline cognitively from testing. Pt modified independent for ADL and mobility. No further OT skilled services required. OT signing off.       Follow Up Recommendations  No OT follow up    Equipment Recommendations  None recommended by OT    Recommendations for Other Services       Precautions / Restrictions Precautions Precautions: None Restrictions Weight Bearing Restrictions: No      Mobility Bed Mobility               General bed mobility comments: pt received and left in recliner  Transfers Overall transfer level: Independent                    Balance Overall balance assessment: No apparent balance deficits (not formally assessed)                                         ADL either performed or assessed with clinical judgement   ADL Overall ADL's : Modified independent                                       General ADL Comments: Pt with no focal deficits noted. Pt using phone for phone  call. Pt answering approprpiately and able to engage.      Vision Baseline Vision/History: Wears glasses Wears Glasses: Reading only;At all times Patient Visual Report: No change from baseline Vision Assessment?: No apparent visual deficits;Yes Eye Alignment: Within Functional Limits Ocular Range of Motion: Within Functional Limits Alignment/Gaze Preference: Within Defined Limits Tracking/Visual Pursuits: Able to track stimulus in all quads without difficulty     Perception     Praxis      Pertinent Vitals/Pain Pain Assessment: No/denies pain     Hand Dominance Right   Extremity/Trunk Assessment Upper Extremity Assessment Upper Extremity Assessment: Overall WFL for tasks assessed   Lower Extremity Assessment Lower Extremity Assessment: Overall WFL for tasks assessed LLE Sensation:  (extinction on L side)   Cervical / Trunk Assessment Cervical / Trunk Assessment: Normal   Communication Communication Communication: No difficulties   Cognition Arousal/Alertness: Awake/alert Behavior During Therapy: WFL for tasks assessed/performed Overall Cognitive Status: Within Functional Limits for tasks assessed  General Comments: Pt verbalizing sequencing task of describing steps to make mac n' cheese; pt talking with friend on phone regarding what is needed from store before he came to visit- she was able to state her size and what items she required with choices. Pt perfoming math task for managerial position to eventually return to-- using calculator with 100% accuracy after pt corrected own mistakes.   General Comments  VSS on RA    Exercises Exercises: Other exercises Other Exercises Other Exercises: math exercises using calculator on phone Other Exercises: how to cooking exercises for sequencing through cognitive tasks   Shoulder Instructions      Home Living Family/patient expects to be discharged to:: Private  residence Living Arrangements: Alone Available Help at Discharge: Family;Available PRN/intermittently Type of Home: House Home Access: Stairs to enter [employer] of Steps: 3 Entrance Stairs-Rails: Right Home Layout: One level     Bathroom Shower/Tub: Teacher, early years/pre: Standard     Home Equipment: None          Prior Functioning/Environment Level of Independence: Independent        Comments: works and drives        OT Problem List:        OT Treatment/Interventions:      OT Goals(Current goals can be found in the care plan section) Acute Rehab OT Goals Patient Stated Goal: to go home and back to work OT Goal Formulation: All assessment and education complete, DC therapy  OT Frequency:     Barriers to D/C:            Co-evaluation              AM-PAC OT "6 Clicks" Daily Activity     Outcome Measure Help from another person eating meals?: None Help from another person taking care of personal grooming?: None Help from another person toileting, which includes using toliet, bedpan, or urinal?: None Help from another person bathing (including washing, rinsing, drying)?: None Help from another person to put on and taking off regular upper body clothing?: None Help from another person to put on and taking off regular lower body clothing?: None 6 Click Score: 24   End of Session Nurse Communication: Mobility status  Activity Tolerance: Patient tolerated treatment well Patient left: in chair;with call bell/phone within reach  OT Visit Diagnosis: Unsteadiness on feet (R26.81)                Time: 1500-1530 OT Time Calculation (min): 30 min Charges:  OT General Charges $OT Visit: 1 Visit OT Evaluation $OT Eval Moderate Complexity: 1 Mod OT Treatments $Cognitive Funtion inital: Initial 15 mins  Jefferey Pica, OTR/L Acute Rehabilitation Services Pager: [phone] Office: [place] 03/17/2020, 5:09 PM

## 2020-03-17 NOTE — Progress Notes (Signed)
STROKE TEAM PROGRESS NOTE   HISTORY OF PRESENT ILLNESS (per record) Chelsea Trevino is an 59 y.o. female who presented to the ED via EMS for assessment of acute onset left sided weakness. LKN was 1130 when she was out shopping. She states that after leaving a store, she started to feel dizzy and then tried to walk back in, but noticed that her gait was unsteady and staggering. Bystanders helped her to sit down, then noted that she was unable to stay sitting up due to left sided weakness. She also had left sided facial droop and slurred speech. EMS was called to the scene, where they noted severe left sided weakness, dysarthria and left facial droop. A Code Stroke was called and she was emergently transported to the [facility] ED. Vitals per EMS: BP 110/70, HR 77, CBG 102.  The patient was unaware of her weakness on arrival. She endorses feeling dizzy when she was found staggering at the store, but is not able to specify what type of dizziness it was. The patient denies any neurological symptoms, including no headache, no weakness (she states this while clearly being plegic on the left), no sensory loss, no trouble talking/understanding speech, no paresthesias and no vision changes. She states that she has a PMHx of breast CA treated about 10 years ago, as well as a cyst "on the roof of my mouth" which was recently excised.  STAT CT head was obtained, revealing a dense right MCA sign, but no other acute abnormality.  CT head: 1. Brain appears normal at this time. However, there is definite long segment hyperdense right MCA consistent with embolic occlusion. 2. ASPECTS is 10 CTA of head and neck:  Embolic occlusion at the right carotid terminus. Right MCA occlusion. Severe stenosis of the right A1 segment but with distal flow. Markedly diminished flow evident throughout the right middle cerebral artery territory. No evidence of hemorrhage or swelling at this point in time. Carotid bifurcations widely  patent. LSN: 1130 tPA Given: Yes   INTERVAL HISTORY She reports that her symptoms have improved status post TPA and thrombectomy.  Systolic blood pressures are running in the 80s and 90s.  She is on pressors to keep her systolic blood pressure above 86V.  She tells me that her blood pressure typically runs low at home in the 80s and 90s.   OBJECTIVE Vitals:   03/17/20 0730 03/17/20 0745 03/17/20 0800 03/17/20 0817  BP: 119/73 (!) 144/116 109/69 (!) 87/54  Pulse: 60 62 76 84  Resp: 17 19 17 16   Temp:   98.5 F (36.9 C)   TempSrc:   Oral   SpO2: 95% 95% 95% 93%  Weight:      Height:        CBC:  Recent Labs  Lab 03/16/20 1209 03/16/20 1209 03/16/20 1213 03/17/20 0410  WBC 6.3  --   --  9.2  NEUTROABS 3.7  --   --  7.8*  HGB 12.6   < > 12.6 12.0  HCT 38.6   < > 37.0 36.5  MCV 96.7  --   --  98.4  PLT 244  --   --  252   < > = values in this interval not displayed.    Basic Metabolic Panel:  Recent Labs  Lab 03/16/20 1209 03/16/20 1209 03/16/20 1213 03/17/20 0410  NA 140   < > 141 141  K 4.1   < > 3.9 4.1  CL 106   < > 104 111  CO2 24  --   --  22  GLUCOSE 94   < > 89 139*  BUN 8   < > 8 7  CREATININE 0.72   < > 0.60 0.66  CALCIUM 9.4  --   --  8.4*   < > = values in this interval not displayed.    Lipid Panel:     Component Value Date/Time   CHOL 205 (H) 03/17/2020 0410   TRIG 54 03/17/2020 0410   HDL 56 03/17/2020 0410   CHOLHDL 3.7 03/17/2020 0410   VLDL 11 03/17/2020 0410   LDLCALC 138 (H) 03/17/2020 0410   HgbA1c:  Lab Results  Component Value Date   HGBA1C 5.7 (H) 03/17/2020   Urine Drug Screen: No results found for: LABOPIA, COCAINSCRNUR, LABBENZ, AMPHETMU, THCU, LABBARB  Alcohol Level     Component Value Date/Time   ETH <10 03/16/2020 1209    IMAGING  MRI Brain WO Contrast - pending 03/17/2020  CT HEAD WO CONTRAST 03/16/2020 IMPRESSION:  Acute ischemic infarction changes within the right insula. Subtle changes of acute ischemic  infarction are also questioned within portions of the right parietal and temporal lobes. No evidence of acute intracranial hemorrhage. Residual circulating contrast material. Paranasal sinus disease as described.   CT HEAD CODE STROKE WO CONTRAST 03/16/2020 IMPRESSION:  1. Brain appears normal at this time. However, there is definite long segment hyperdense right MCA consistent with embolic occlusion.  2. ASPECTS is 10   CT ANGIO HEAD CODE STROKE CT ANGIO NECK CODE STROKE 03/16/2020 IMPRESSION:  Embolic occlusion at the right carotid terminus. Right MCA occlusion. Severe stenosis of the right A1 segment but with distal flow. Markedly diminished flow evident throughout the right middle cerebral artery territory. No evidence of hemorrhage or swelling at this point in time. Carotid bifurcations widely patent.  Neuro Interventional Radiology - Cerebral Angiogram with Intervention 03/16/20 S/P RT common carotid artrriogram. RT CFA approach. Findings. 1.Complete revascularization of occlded RT MCA prox M1 with x1 pass with 40 mm solitaire X retrieve and contact aspiration and prox flow arrest with TICI 3 revascularization. Left intubated. RT groin hemostasis with an 8 F angioseal closure device . Distal pulses  Palpable DPs and PTS bilaterally.CT brain early hyoattenation in the Rt putamen and the subcortical parietal region. Patient extubated in the angio suite after a CT of the brain.  Patient maintaining her oxygen concentrations.    Transthoracic Echocardiogram  00/00/2021 Pending   ECG - pending   PHYSICAL EXAM Blood pressure (!) 87/54, pulse 84, temperature 98.5 F (36.9 C), temperature source Oral, resp. rate 16, height 5\' 4"  (1.626 m), weight 45.3 kg, SpO2 93 %. GENERAL: She is doing well at this time.  HEENT: Neck is supple no trauma noted.  ABDOMEN: soft  EXTREMITIES: No edema   BACK: Normal  SKIN: Normal by inspection.    MENTAL STATUS: Alert and oriented.  Speech, language and cognition are generally intact. Judgment and insight normal.   CRANIAL NERVES: Pupils are equal, round and reactive to light and accomodation; extra ocular movements are full, there is no significant nystagmus; visual fields are full; upper and lower facial muscles are normal in strength and symmetric, there is no flattening of the nasolabial folds; tongue is midline; uvula is midline; shoulder elevation is normal.  MOTOR: Normal tone, bulk and strength; no pronator drift.  COORDINATION: Left finger to nose is normal, right finger to nose is normal, No rest tremor; no intention tremor; no postural tremor;  no bradykinesia.  SENSATION: Normal to light touch, temperature, and pain.  There is extinction on double simultaneous stimulation on the left side including the upper and lower extremities.         ASSESSMENT/PLAN Ms. Chelsea Trevino is a 59 y.o. female with history of breast CA 10 years ago and recent cyst excised from the roof of her mouth presenting with acute onset left sided weakness, dizziness, unsteady gait, facial droop and slurred speech. The patient received IV t-PA Saturday 03/16/20 at 1245. Neuro Interventional Radiology - Cerebral Angiogram with Intervention Complete revascularization of occlded RT MCA prox M1 with x1 pass with 40 mm solitaire X retrieve and contact aspiration and prox flow arrest with TICI 3 revascularization.  Stroke: Acute ischemic infarction changes within the right insula. Subtle changes of acute ischemic infarction are also questioned within portions of the right parietal and temporal lobes - embolic - source unknown.  Resultant left-sided cortical sensory impairment.  Code Stroke CT Head - Brain appears normal at this time. However, there is definite long segment hyperdense right MCA consistent with embolic occlusion. ASPECTS is 10   CT head - Acute ischemic infarction changes within the right insula. Subtle changes of acute  ischemic infarction are also questioned within portions of the right parietal and temporal lobes.  MRI head - pending  MRA head - not ordered  CTA H&N - Embolic occlusion at the right carotid terminus. Right MCA occlusion. Severe stenosis of the right A1 segment but with distal flow. Markedly diminished flow evident throughout the right middle cerebral artery territory. No evidence of hemorrhage or swelling at this point in time. Carotid bifurcations widely patent.  CT Perfusion - not ordered  Carotid Doppler - CTA neck performed - carotid dopplers not indicated.  2D Echo - pending  Loyal Jacobson Virus 2 - negative  LDL - 138  HgbA1c - 5.7  UDS - pending  VTE prophylaxis - SCDs Diet  Diet Order            Diet regular Room service appropriate? Yes; Fluid consistency: Thin  Diet effective now                 No antithrombotic prior to admission, now on No antithrombotic post tPA  Patient will be counseled to be compliant with her antithrombotic medications  Ongoing aggressive stroke risk factor management  Therapy recommendations:  pending  Disposition:  Pending  Hypertension  Home BP meds: none   Current BP meds: Cleviprex prn  Hypotension -> phenylephrine. 1 Liter NS bolus with NS IV 75 cc / hr. . Permissive hypertension (OK if < 220/120) but gradually normalize in 5-7 days  . Long-term BP goal normotensive  Hyperlipidemia  Home Lipid lowering medication: none   LDL 138, goal < 70  Current lipid lowering medication: add Lipitor 80 mg daily   Continue statin at discharge   Other Stroke Risk Factors  Advanced age  Cigarette smoker - not on file  ETOH use - not on file  Family hx stroke - not on file  Other Active Problems  Code status - Full code   Bradycardia - 40's  Pt's mother has hx of coagulopathy and PE (see Dr Shelbie Hutching progress note)  Hypercoagulable labs - pending  Hospital day # 1  This patient is critically ill due to stroke  post procedure and at significant risk of neurological worsening, death form severe anemia, bleeding, recurrent stroke, intracranial stenosis. This patient's care requires constant monitoring of  vital signs, hemodynamics, respiratory and cardiac monitoring, review of multiple databases, neurological assessment, other specialists and medical decision making of high complexity. I spent 35 minutes of neurocritical care time in the care of this patient.    To contact Stroke Continuity provider, please refer to [email or web address]. After hours, contact General Neurology

## 2020-03-17 NOTE — Evaluation (Signed)
Physical Therapy Evaluation Patient Details Name: Chelsea Trevino MRN: [id number] DOB: Aug 24, 1960 Today's Date: 03/17/2020   History of Present Illness  59 yo female s/p L sided weakness and pain in L side.; dizziness, unsteadiness with mobility. Pt was working during incident. Pt CT head demonstrating R MCA embolic occlusion. Pt received IV tPA and underwent angiogram and revascularization in IR on 7/31.  Clinical Impression  Pt presents to PT at or near her functional baseline. Pt's only current deficits appears to be extinction to sensation on LLE, but otherwise she is independent in all mobility tasks. Pt ambulates, negotiates stairs, and performs transfers without assistance or balance deviation. Pt requires no further acute PT services as she is at her baseline. Acute PT signing off.    Follow Up Recommendations No PT follow up    Equipment Recommendations  None recommended by PT    Recommendations for Other Services       Precautions / Restrictions Precautions Precautions: None Restrictions Weight Bearing Restrictions: No      Mobility  Bed Mobility               General bed mobility comments: pt received and left in recliner  Transfers Overall transfer level: Independent                  Ambulation/Gait Ambulation/Gait assistance: Independent Gait Distance (Feet): 300 Feet Assistive device: None Gait Pattern/deviations: WFL(Within Functional Limits) Gait velocity: functional Gait velocity interpretation: >4.37 ft/sec, indicative of normal walking speed General Gait Details: pt is able to turn head, change gait speed, stop and turn abruptly, and walk backward without gait deviation  Stairs Stairs: Yes Stairs assistance: Modified independent (Device/Increase time) Stair Management: One rail Right Number of Stairs: 4    Wheelchair Mobility    Modified Rankin (Stroke Patients Only) Modified Rankin (Stroke Patients Only) Pre-Morbid Rankin Score:  No symptoms Modified Rankin: No significant disability     Balance Overall balance assessment: No apparent balance deficits (not formally assessed)                                           Pertinent Vitals/Pain Pain Assessment: No/denies pain    Home Living Family/patient expects to be discharged to:: Private residence Living Arrangements: Alone Available Help at Discharge: Family;Available PRN/intermittently Type of Home: House Home Access: Stairs to enter Entrance Stairs-Rails: Right Entrance Stairs-Number of Steps: 3 Home Layout: One level Home Equipment: None      Prior Function Level of Independence: Independent         Comments: working, driving     Hand Dominance   Dominant Hand: Right    Extremity/Trunk Assessment   Upper Extremity Assessment Upper Extremity Assessment: Overall WFL for tasks assessed    Lower Extremity Assessment Lower Extremity Assessment: Overall WFL for tasks assessed LLE Sensation:  (extinction on L side)    Cervical / Trunk Assessment Cervical / Trunk Assessment: Normal  Communication   Communication: No difficulties  Cognition Arousal/Alertness: Awake/alert Behavior During Therapy: WFL for tasks assessed/performed Overall Cognitive Status: Within Functional Limits for tasks assessed                                        General Comments General comments (skin integrity, edema, etc.): VSS on RA  Exercises     Assessment/Plan    PT Assessment Patent does not need any further PT services  PT Problem List         PT Treatment Interventions      PT Goals (Current goals can be found in the Care Plan section)       Frequency     Barriers to discharge        Co-evaluation               AM-PAC PT "6 Clicks" Mobility  Outcome Measure Help needed turning from your back to your side while in a flat bed without using bedrails?: None Help needed moving from lying on your  back to sitting on the side of a flat bed without using bedrails?: None Help needed moving to and from a bed to a chair (including a wheelchair)?: None Help needed standing up from a chair using your arms (e.g., wheelchair or bedside chair)?: None Help needed to walk in hospital room?: None Help needed climbing 3-5 steps with a railing? : None 6 Click Score: 24    End of Session   Activity Tolerance: Patient tolerated treatment well Patient left: in chair;with call bell/phone within reach Nurse Communication: Mobility status      Time: 1478-2956 PT Time Calculation (min) (ACUTE ONLY): 16 min   Charges:   PT Evaluation $PT Eval Moderate Complexity: 1 Mod          Arlyss Gandy, PT, DPT Acute Rehabilitation Pager: [phone]   Arlyss Gandy 03/17/2020, 1:47 PM

## 2020-03-17 NOTE — Progress Notes (Signed)
°  Echocardiogram 2D Echocardiogram has been performed.  Chelsea Trevino 03/17/2020, 2:27 PM

## 2020-03-18 ENCOUNTER — Encounter (HOSPITAL_COMMUNITY): Payer: Self-pay | Admitting: Radiology

## 2020-03-18 LAB — PROTEIN S, TOTAL: Protein S Ag, Total: 69 % (ref 60–150)

## 2020-03-18 LAB — LUPUS ANTICOAGULANT PANEL
DRVVT: 31.4 s (ref 0.0–47.0)
PTT Lupus Anticoagulant: 32 s (ref 0.0–51.9)

## 2020-03-18 LAB — CARDIOLIPIN ANTIBODIES, IGG, IGM, IGA
Anticardiolipin IgA: <9 APL U/mL (ref 0–11)
Anticardiolipin IgG: <9 GPL U/mL (ref 0–14)
Anticardiolipin IgM: 9 MPL U/mL (ref 0–12)

## 2020-03-18 LAB — PROTEIN S ACTIVITY: Protein S Activity: 86 % (ref 63–140)

## 2020-03-18 LAB — PROTEIN C ACTIVITY: Protein C Activity: 96 % (ref 73–180)

## 2020-03-18 MED ORDER — ENOXAPARIN SODIUM 30 MG/0.3ML ~~LOC~~ SOLN
30.0000 mg | SUBCUTANEOUS | Status: DC
  Administered 2020-03-18 – 2020-03-19 (×2): 30 mg via SUBCUTANEOUS
  Filled 2020-03-18 (×2): qty 0.3

## 2020-03-18 MED ORDER — PENICILLIN V POTASSIUM 250 MG PO TABS
500.0000 mg | ORAL_TABLET | Freq: Four times a day (QID) | ORAL | Status: DC
  Administered 2020-03-18 – 2020-03-19 (×6): 500 mg via ORAL
  Filled 2020-03-18 (×8): qty 2

## 2020-03-18 MED ORDER — CLOPIDOGREL BISULFATE 75 MG PO TABS
75.0000 mg | ORAL_TABLET | Freq: Every day | ORAL | Status: DC
  Administered 2020-03-18 – 2020-03-19 (×2): 75 mg via ORAL
  Filled 2020-03-18 (×2): qty 1

## 2020-03-18 MED ORDER — ASPIRIN EC 81 MG PO TBEC
81.0000 mg | DELAYED_RELEASE_TABLET | Freq: Every day | ORAL | Status: DC
  Administered 2020-03-18 – 2020-03-19 (×2): 81 mg via ORAL
  Filled 2020-03-18 (×2): qty 1

## 2020-03-18 NOTE — Evaluation (Signed)
Speech Language Pathology Evaluation Patient Details Name: Chelsea Trevino MRN: [id number] DOB: [birth year] Today's Date: 03/18/2020 Time: 1000-1026 SLP Time Calculation (min) (ACUTE ONLY): 26 min  Problem List:  Patient Active Problem List   Diagnosis Date Noted  . Stroke (cerebrum) (HCC) 03/16/2020  . Middle cerebral artery embolism, right 03/16/2020   Past Medical History: History reviewed. No pertinent past medical history. Past Surgical History: The histories are not reviewed yet. Please review them in the "History" navigator section and refresh this SmartLink. HPI:  Pt is a 59 yo female presenting with acute onset L weakness and slurred speech.  CT head demonstrating R MCA embolic occlusion. Pt received IV tPA and underwent angiogram and revascularization in IR on 7/31. Per MD notes, pt reports PMH of breast ca and recent excision of cyst from the roof of her mouth.    Assessment / Plan / Recommendation Clinical Impression  Pt scored a 27/30 on the SLUMS, which is within a normal range. She denies any acute changes in cognition or communication. Mild extra time was given for a few subtests and she had a few instances in which she appropriately self-corrected errors, but she believes she is at or near her baseline level of function. No further SLP f/u recommended at this time, although reviewed safety precautions to take upon discharge.     SLP Assessment  SLP Recommendation/Assessment: Patient does not need any further Speech Lanaguage Pathology Services SLP Visit Diagnosis: Cognitive communication deficit (R41.841)    Follow Up Recommendations  None    Frequency and Duration           SLP Evaluation Cognition  Overall Cognitive Status: Within Functional Limits for tasks assessed Orientation Level: Oriented X4       Comprehension  Auditory Comprehension Overall Auditory Comprehension: Appears within functional limits for tasks assessed    Expression Expression Primary  Mode of Expression: Verbal Verbal Expression Overall Verbal Expression: Appears within functional limits for tasks assessed   Oral / Motor  Motor Speech Overall Motor Speech: Appears within functional limits for tasks assessed   GO                    Mahala Menghini., M.A. CCC-SLP Acute Rehabilitation Services Pager [phone] Office [phone]  03/18/2020, 10:36 AM

## 2020-03-18 NOTE — Progress Notes (Signed)
Referring Physician(s): Code Stroke Caryl PinaEric Lindzen  Supervising Physician: Julieanne Cottoneveshwar, Sanjeev  Patient Status:  [name]  Chief Complaint:  Code stroke  Brief History:  Ms.  Chelsea Trevino 59 y.o. female inpatient. 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   Patient [facility] with a new onset of left sided weakness, left sided sensory deficit and left sided neglect.   CT heads showed an occlusion of the right artery proximally to the right anterior cerebral AL segment.   She is S/P RT common carotid arteriogram. RT CFA approach. Complete revascularization of occlded RT MCA prox M1 with x1 pass with 4mmx 40 mm solitaire X retrieve and contact aspiration and prox flow arrest with TICI 3 revascularization by Dr. Corliss Skainseveshwar on 03/16/20.  CT brain early hyoattenation in the Rt putamen and the subcortical parietal region. on 7.31.21.   Per note from Dr. Otelia LimesLindzen dated 7.31.21 patient will be placed on eliquis for her homocysteine levels.  Subjective:  Was sleeping upon our arrival. Wakes easily.   Allergies: Codeine and Latex  Medications: Prior to Admission medications   Medication Sig Start Date End Date Taking? Authorizing Provider  penicillin v potassium (VEETID) 500 MG tablet Take 500 mg by mouth 4 (four) times daily. 03/07/20  Yes [provider]     Vital Signs: BP (!) 89/62   Pulse 59   Temp 98.3 F (36.8 C) (Oral)   Resp 20   Ht 5\' 4"  (1.626 m)   Wt 45.3 kg   SpO2 95%   BMI 17.14 kg/m   Physical Exam Vitals reviewed.  Constitutional:      Appearance: Normal appearance.  HENT:     Head: Normocephalic and atraumatic.  Cardiovascular:     Rate and Rhythm: Normal rate.  Pulmonary:     Effort: Pulmonary effort is normal. No respiratory distress.  Musculoskeletal:        General: Normal range of motion.  Skin:    General: Skin is warm and dry.   Neurological:     Mental Status: She is alert.     Comments: RN reported minor left upper extremity sensory deficit and minor left sided facial droop yesterday. There is improvement today and facial droop not as evident.  Psychiatric:        Mood and Affect: Mood normal.        Behavior: Behavior normal.        Thought Content: Thought content normal.        Judgment: Judgment normal.     Imaging: CT HEAD WO CONTRAST  Result Date: 03/16/2020 CLINICAL DATA:  Stroke, follow-up.  Follow-up CVA post intervention. EXAM: CT HEAD WITHOUT CONTRAST TECHNIQUE: Contiguous axial images were obtained from the base of the skull through the vertex without intravenous contrast. COMPARISON:  Noncontrast head CT and CT angiogram head/neck performed earlier the same day 03/16/2020. FINDINGS: Brain: Cerebral volume is normal. There is no evidence of acute intracranial hemorrhage. There is abnormal hypodensity consistent with acute infarction within the right insula. Subtle loss of gray-white differentiation is also questioned within portions of the right parietal and temporal lobes. No extra-axial fluid collection. No evidence of intracranial mass. No midline shift. Vascular: Residual circulating contrast material limits evaluation for hyperdense vessels. Skull: Normal. Negative for fracture or focal lesion. Sinuses/Orbits: Visualized orbits show no acute finding. Paranasal sinus mucosal thickening. Most notably, moderate mucosal thickening is  present within the ethmoid air cells. No significant mastoid effusion IMPRESSION: Acute ischemic infarction changes within the right insula. Subtle changes of acute ischemic infarction are also questioned within portions of the right parietal and temporal lobes. No evidence of acute intracranial hemorrhage. Residual circulating contrast material. Paranasal sinus disease as described. Electronically Signed   By: Jackey Loge DO   On: 03/16/2020 14:56   MR BRAIN WO  CONTRAST  Result Date: 03/17/2020 CLINICAL DATA:  Follow-up stroke. Left-sided weakness and facial droop. Right MCA embolus with subsequent intervention. EXAM: MRI HEAD WITHOUT CONTRAST TECHNIQUE: Multiplanar, multiecho pulse sequences of the brain and surrounding structures were obtained without intravenous contrast. COMPARISON:  CT studies done yesterday. FINDINGS: Brain: Diffusion imaging shows low level restricted diffusion along the gyral surfaces the insula and right frontoparietal brain. Acute infarction is evident in the caudate and putamen on the right. Few small separate foci of acute infarction in the right occipital and posterior parietal region. Minimal cortical swelling but no mass effect. No evidence of hemorrhage. Single punctate focus susceptibility artifact in the right posterior temporal subcortical white matter. No old ischemic changes are evident elsewhere. Vascular: Major vessels at the base of the brain show flow. Skull and upper cervical spine: Negative Sinuses/Orbits: Clear/normal Other: None IMPRESSION: Acute infarction affecting the caudate and putamen on the right. Lower level restricted diffusion affecting the cortical brain in the insula and frontoparietal region. Few other scattered small infarctions at the left posterior temporal/occipital junction and the left parietal white matter. No sign of hemorrhagic complication. No mass effect or shift. Electronically Signed   By: Paulina Fusi M.D.   On: 03/17/2020 11:22   ECHOCARDIOGRAM COMPLETE  Result Date: 03/17/2020    ECHOCARDIOGRAM REPORT   Patient Name:   Chelsea Trevino Date of Exam: 03/17/2020 Medical Rec #:  [id number]        Height:       64.0 in Accession #:    [id number]       Weight:       99.9 lb Date of Birth:  1961/02/10         BSA:          1.456 m Patient Age:    59 years         BP:           94/65 mmHg Patient Gender: F                HR:           48 bpm. Exam Location:  Inpatient Procedure: 2D Echo, Cardiac Doppler  and Color Doppler Indications:    Stroke  History:        Patient has no prior history of Echocardiogram examinations.                 Signs/Symptoms:Dizziness/Lightheadedness.  Sonographer:    Sheralyn Boatman RDCS Referring Phys: [id number] ERIC LINDZEN IMPRESSIONS  1. Left ventricular ejection fraction, by estimation, is 60 to 65%. The left ventricle has normal function. The left ventricle has no regional wall motion abnormalities. Left ventricular diastolic parameters were normal.  2. Right ventricular systolic function is normal. The right ventricular size is normal. There is mildly elevated pulmonary artery systolic pressure. The estimated right ventricular systolic pressure is 41.0 mmHg.  3. The mitral valve is normal in structure. Mild mitral valve regurgitation. No evidence of mitral stenosis.  4. Tricuspid valve regurgitation is mild to moderate.  5. The aortic valve is normal in  structure. Aortic valve regurgitation is not visualized. No aortic stenosis is present.  6. The inferior vena cava is dilated in size with <50% respiratory variability, suggesting right atrial pressure of 15 mmHg. Conclusion(s)/Recommendation(s): No intracardiac source of embolism detected on this transthoracic study. A transesophageal echocardiogram is recommended to exclude cardiac source of embolism if clinically indicated. FINDINGS  Left Ventricle: Left ventricular ejection fraction, by estimation, is 60 to 65%. The left ventricle has normal function. The left ventricle has no regional wall motion abnormalities. The left ventricular internal cavity size was normal in size. There is  no left ventricular hypertrophy. Left ventricular diastolic parameters were normal. Right Ventricle: The right ventricular size is normal. No increase in right ventricular wall thickness. Right ventricular systolic function is normal. There is mildly elevated pulmonary artery systolic pressure. The tricuspid regurgitant velocity is 2.55  m/s, and with an  assumed right atrial pressure of 15 mmHg, the estimated right ventricular systolic pressure is 41.0 mmHg. Left Atrium: Left atrial size was normal in size. Right Atrium: Right atrial size was normal in size. Pericardium: There is no evidence of pericardial effusion. Mitral Valve: The mitral valve is normal in structure. There is mild thickening of the mitral valve leaflet(s). Normal mobility of the mitral valve leaflets. Mild mitral valve regurgitation. No evidence of mitral valve stenosis. Tricuspid Valve: The tricuspid valve is normal in structure. Tricuspid valve regurgitation is mild to moderate. No evidence of tricuspid stenosis. Aortic Valve: The aortic valve is normal in structure. Aortic valve regurgitation is not visualized. No aortic stenosis is present. Pulmonic Valve: The pulmonic valve was normal in structure. Pulmonic valve regurgitation is not visualized. No evidence of pulmonic stenosis. Aorta: The aortic root is normal in size and structure. Venous: The inferior vena cava is dilated in size with less than 50% respiratory variability, suggesting right atrial pressure of 15 mmHg. IAS/Shunts: No atrial level shunt detected by color flow Doppler.  LEFT VENTRICLE PLAX 2D LVIDd:         4.30 cm     Diastology LVIDs:         2.70 cm     LV e' lateral:   10.40 cm/s LV PW:         1.10 cm     LV E/e' lateral: 6.1 LV IVS:        1.00 cm     LV e' medial:    9.57 cm/s LVOT diam:     2.00 cm     LV E/e' medial:  6.6 LV SV:         66 LV SV Index:   46 LVOT Area:     3.14 cm  LV Volumes (MOD) LV vol d, MOD A2C: 82.0 ml LV vol d, MOD A4C: 73.8 ml LV vol s, MOD A2C: 25.1 ml LV vol s, MOD A4C: 26.8 ml LV SV MOD A2C:     56.9 ml LV SV MOD A4C:     73.8 ml LV SV MOD BP:      52.5 ml RIGHT VENTRICLE             IVC RV S prime:     12.30 cm/s  IVC diam: 2.80 cm TAPSE (M-mode): 3.3 cm LEFT ATRIUM             Index       RIGHT ATRIUM          Index LA diam:        3.10 cm 2.13  cm/m  RA Area:     9.94 cm LA Vol (A2C):    28.3 ml 19.43 ml/m RA Volume:   21.80 ml 14.97 ml/m LA Vol (A4C):   21.8 ml 14.97 ml/m LA Biplane Vol: 25.5 ml 17.51 ml/m  AORTIC VALVE LVOT Vmax:   85.50 cm/s LVOT Vmean:  51.700 cm/s LVOT VTI:    0.211 m  AORTA Ao Root diam: 3.00 cm Ao Asc diam:  3.50 cm MITRAL VALVE               TRICUSPID VALVE MV Area (PHT): 1.86 cm    TR Peak grad:   26.0 mmHg MV Decel Time: 408 msec    TR Vmax:        255.00 cm/s MV E velocity: 63.40 cm/s MV A velocity: 41.30 cm/s  SHUNTS MV E/A ratio:  1.54        Systemic VTI:  0.21 m                            Systemic Diam: 2.00 cm Donato Schultz MD Electronically signed by Donato Schultz MD Signature Date/Time: 03/17/2020/2:49:25 PM    Final    CT HEAD CODE STROKE WO CONTRAST  Result Date: 03/16/2020 CLINICAL DATA:  Code stroke.  Left-sided weakness and facial droop. EXAM: CT HEAD WITHOUT CONTRAST TECHNIQUE: Contiguous axial images were obtained from the base of the skull through the vertex without intravenous contrast. COMPARISON:  None. FINDINGS: Brain: Brain parenchyma itself appears normal. No early ischemic signs. No evidence of old infarction. No hemorrhage, hydrocephalus or extra-axial collection. Vascular: Definite long segment hyperdense right MCA consistent with acute embolic occlusion. Skull: Normal Sinuses/Orbits: Clear Other: None ASPECTS (Alberta Stroke Program Early CT Score) - Ganglionic level infarction (caudate, lentiform nuclei, internal capsule, insula, M1-M3 cortex): 7 - Supraganglionic infarction (M4-M6 cortex): 3 Total score (0-10 with 10 being normal): 10 IMPRESSION: 1. Brain appears normal at this time. However, there is definite long segment hyperdense right MCA consistent with embolic occlusion. 2. ASPECTS is 10 3. Findings discussed with Dr. Otelia Limes during interpretation at 1220 hours. Electronically Signed   By: Paulina Fusi M.D.   On: 03/16/2020 12:23   CT ANGIO HEAD CODE STROKE  Result Date: 03/16/2020 CLINICAL DATA:  Acute left-sided weakness,  slurred speech and facial droop. EXAM: CT ANGIOGRAPHY HEAD AND NECK TECHNIQUE: Multidetector CT imaging of the head and neck was performed using the standard protocol during bolus administration of intravenous contrast. Multiplanar CT image reconstructions and MIPs were obtained to evaluate the vascular anatomy. Carotid stenosis measurements (when applicable) are obtained utilizing NASCET criteria, using the distal internal carotid diameter as the denominator. CONTRAST:  Not annotated COMPARISON:  Head CT earlier same day FINDINGS: CTA NECK FINDINGS Aortic arch: Mild atherosclerosis of the aortic arch. Branching pattern is normal without origin stenosis. Right carotid system: Common carotid artery widely patent to the carotid bifurcation. Carotid bifurcation is normal without soft or calcified plaque. Cervical ICA widely patent. Left carotid system: Left common carotid artery widely patent to the bifurcation. Carotid bifurcation is normal without soft or calcified plaque. Cervical ICA is normal. Vertebral arteries: Both vertebral artery origins widely patent. Both vertebral arteries widely patent through the foramen magnum. Skeleton: Mid cervical spondylosis. Other neck: No mass or lymphadenopathy. Upper chest: Normal Review of the MIP images confirms the above findings CTA HEAD FINDINGS Anterior circulation: Left internal carotid artery widely patent through the skull base and siphon region.  Normal filling of the left anterior and middle cerebral branches. Right supraclinoid ICA occlusion at the carotid terminus with occlusion of the right middle cerebral artery. Stenosis of the A1 segment indicating that some clot extends into that vessel, but there is more distal filling probably due 2 some coexistent flow as well as a patent anterior communicating artery. One can appreciate markedly diminished flow throughout the right middle cerebral artery territory. Posterior circulation: Both vertebral arteries widely patent  to the basilar. No basilar stenosis. Posterior circulation branch vessels are normal. Large posterior communicating artery on the right. Venous sinuses: Patent and normal. Anatomic variants: None significant. Review of the MIP images confirms the above findings IMPRESSION: Embolic occlusion at the right carotid terminus. Right MCA occlusion. Severe stenosis of the right A1 segment but with distal flow. Markedly diminished flow evident throughout the right middle cerebral artery territory. No evidence of hemorrhage or swelling at this point in time. Carotid bifurcations widely patent. Findings discussed with Dr. Otelia Limes during interpretation at 1234 hours. Electronically Signed   By: Paulina Fusi M.D.   On: 03/16/2020 12:39   CT ANGIO NECK CODE STROKE  Result Date: 03/16/2020 CLINICAL DATA:  Acute left-sided weakness, slurred speech and facial droop. EXAM: CT ANGIOGRAPHY HEAD AND NECK TECHNIQUE: Multidetector CT imaging of the head and neck was performed using the standard protocol during bolus administration of intravenous contrast. Multiplanar CT image reconstructions and MIPs were obtained to evaluate the vascular anatomy. Carotid stenosis measurements (when applicable) are obtained utilizing NASCET criteria, using the distal internal carotid diameter as the denominator. CONTRAST:  Not annotated COMPARISON:  Head CT earlier same day FINDINGS: CTA NECK FINDINGS Aortic arch: Mild atherosclerosis of the aortic arch. Branching pattern is normal without origin stenosis. Right carotid system: Common carotid artery widely patent to the carotid bifurcation. Carotid bifurcation is normal without soft or calcified plaque. Cervical ICA widely patent. Left carotid system: Left common carotid artery widely patent to the bifurcation. Carotid bifurcation is normal without soft or calcified plaque. Cervical ICA is normal. Vertebral arteries: Both vertebral artery origins widely patent. Both vertebral arteries widely patent  through the foramen magnum. Skeleton: Mid cervical spondylosis. Other neck: No mass or lymphadenopathy. Upper chest: Normal Review of the MIP images confirms the above findings CTA HEAD FINDINGS Anterior circulation: Left internal carotid artery widely patent through the skull base and siphon region. Normal filling of the left anterior and middle cerebral branches. Right supraclinoid ICA occlusion at the carotid terminus with occlusion of the right middle cerebral artery. Stenosis of the A1 segment indicating that some clot extends into that vessel, but there is more distal filling probably due 2 some coexistent flow as well as a patent anterior communicating artery. One can appreciate markedly diminished flow throughout the right middle cerebral artery territory. Posterior circulation: Both vertebral arteries widely patent to the basilar. No basilar stenosis. Posterior circulation branch vessels are normal. Large posterior communicating artery on the right. Venous sinuses: Patent and normal. Anatomic variants: None significant. Review of the MIP images confirms the above findings IMPRESSION: Embolic occlusion at the right carotid terminus. Right MCA occlusion. Severe stenosis of the right A1 segment but with distal flow. Markedly diminished flow evident throughout the right middle cerebral artery territory. No evidence of hemorrhage or swelling at this point in time. Carotid bifurcations widely patent. Findings discussed with Dr. Otelia Limes during interpretation at 1234 hours. Electronically Signed   By: Paulina Fusi M.D.   On: 03/16/2020 12:39    Labs:  CBC: Recent Labs    03/16/20 1209 03/16/20 1213 03/17/20 0410  WBC 6.3  --  9.2  HGB 12.6 12.6 12.0  HCT 38.6 37.0 36.5  PLT 244  --  252    COAGS: Recent Labs    03/16/20 1209  INR 1.0  APTT 27    BMP: Recent Labs    03/16/20 1209 03/16/20 1213 03/17/20 0410  NA 140 141 141  K 4.1 3.9 4.1  CL 106 104 111  CO2 24  --  22  GLUCOSE 94  89 139*  BUN 8 8 7   CALCIUM 9.4  --  8.4*  CREATININE 0.72 0.60 0.66  GFRNONAA >60  --  >60  GFRAA >60  --  >60    LIVER FUNCTION TESTS: Recent Labs    03/16/20 1209  BILITOT 0.7  AST 17  ALT 19  ALKPHOS 59  PROT 5.7*  ALBUMIN 3.4*    Assessment and Plan:  Code stroke.  S/P RT common carotid arteriogram with complete revascularization of occlded RT MCA prox M1 with x1 pass with 03/18/20 40 mm solitaire X retrieve and contact aspiration and prox flow arrest with TICI 3 revascularization by Dr. on 03/16/20.  Care per neurology.  Electronically Signed: 03/18/20, PA-C 03/18/2020, 10:41 AM    I spent a total of 15 Minutes at the the patient's bedside AND on the patient's hospital floor or unit, greater than 50% of which was counseling/coordinating care for f/u cerebral intervention.

## 2020-03-18 NOTE — Progress Notes (Signed)
ANTICOAGULATION CONSULT NOTE - Initial Consult  Pharmacy Consult for Lovenox Indication: VTE prophylaxis  Allergies  Allergen Reactions  . Codeine Other (See Comments)    Other Reaction: Nausea & Vomiting  . Latex Rash    Patient Measurements: Height: 5\' 4"  (162.6 cm) Weight: 45.3 kg (99 lb 13.9 oz) IBW/kg (Calculated) : 54.7  Vital Signs: Temp: 98.2 F (36.8 C) (08/02 1200) Temp Source: Oral (08/02 1200) BP: 99/57 (08/02 1217) Pulse Rate: 59 (08/02 0900)  Labs: Recent Labs    03/16/20 1209 03/16/20 1209 03/16/20 1213 03/17/20 0410  HGB 12.6   < > 12.6 12.0  HCT 38.6  --  37.0 36.5  PLT 244  --   --  252  APTT 27  --   --   --   LABPROT 12.6  --   --   --   INR 1.0  --   --   --   CREATININE 0.72  --  0.60 0.66   < > = values in this interval not displayed.    Estimated Creatinine Clearance: 53.5 mL/min (by C-G formula based on SCr of 0.66 mg/dL).   Medical History: History reviewed. No pertinent past medical history.  Medications:  Medications Prior to Admission  Medication Sig Dispense Refill Last Dose  . penicillin v potassium (VEETID) 500 MG tablet Take 500 mg by mouth 4 (four) times daily.   03/16/2020 at Unknown time    Assessment: 73 YOF who presented with L sided weakness found to have a R MCA M1 occlusion s/p alteplase and thrombectomy. Pharmacy consulted to start Lovenox for VTE prophylaxis.   H/h and Plt wnl. SCr wnl. Of note, patient only weight 45.3 kg   Goal of Therapy:  VTE prophylaxis Monitor platelets by anticoagulation protocol: Yes   Plan:  -Given low body weight, will start Lovenox 30 mg daily -monitor renal fx and for s/s of bleeding   66, PharmD., BCPS, BCCCP Clinical Pharmacist Clinical phone for 03/18/20 until 3:30pm: [phone] If after 3:30pm, please refer to [facility] for unit-specific pharmacist

## 2020-03-18 NOTE — Progress Notes (Signed)
    [facility] has been requested to perform a transesophageal echocardiogram on 8/3 for CVA.  After careful review of history and examination, the risks and benefits of transesophageal echocardiogram have been explained including risks of esophageal damage, perforation (1:10,000 risk), bleeding, pharyngeal hematoma as well as other potential complications associated with conscious sedation including aspiration, arrhythmia, respiratory failure and death. Alternatives to treatment were discussed, questions were answered. Patient is willing to proceed.   Patient presented with CVA, TEE +/- loop was requested. Vital shows borderline BP 99/57. Platelet ok, hemoglobin normal. Need to review BP tomorrow AM prior to sedation.   Chelsea Trevino, [place] 03/18/2020 3:16 PM

## 2020-03-18 NOTE — Progress Notes (Signed)
STROKE TEAM PROGRESS NOTE   INTERVAL HISTORY I have personally reviewed history of presenting illness with the patient, electronic medical records and imaging films in PACS.  She presented with right M1 occlusion and was treated with IV TPA followed by successful mechanical thrombectomy.  She is doing well and has made almost complete recovery with no focal deficits.  Vital signs stable.  Blood pressure well controlled.  MRI scan shows right basal ganglia and insular and frontoparietal cortical scattered infarcts.  2D echo is unremarkable.  LDL cholesterol is 138 mg percent.  Hemoglobin A1c is 5.7.  Patient has a family history of elevated homocysteine and denies prior history of DVT, pulmonary embolism.  Her hypercoagulable panel has been sent and is pending. OBJECTIVE Vitals:   03/18/20 0815 03/18/20 0830 03/18/20 0845 03/18/20 0900  BP: 106/67 116/68 (!) 128/96 95/68  Pulse: 58 51 56 59  Resp: 19 15 19 15   Temp:      TempSrc:      SpO2: 97% 94% 97% 95%  Weight:      Height:       CBC:  Recent Labs  Lab 03/16/20 1209 03/16/20 1209 03/16/20 1213 03/17/20 0410  WBC 6.3  --   --  9.2  NEUTROABS 3.7  --   --  7.8*  HGB 12.6   < > 12.6 12.0  HCT 38.6   < > 37.0 36.5  MCV 96.7  --   --  98.4  PLT 244  --   --  252   < > = values in this interval not displayed.   Basic Metabolic Panel:  Recent Labs  Lab 03/16/20 1209 03/16/20 1209 03/16/20 1213 03/17/20 0410  NA 140   < > 141 141  K 4.1   < > 3.9 4.1  CL 106   < > 104 111  CO2 24  --   --  22  GLUCOSE 94   < > 89 139*  BUN 8   < > 8 7  CREATININE 0.72   < > 0.60 0.66  CALCIUM 9.4  --   --  8.4*   < > = values in this interval not displayed.   Lipid Panel:     Component Value Date/Time   CHOL 205 (H) 03/17/2020 0410   TRIG 54 03/17/2020 0410   HDL 56 03/17/2020 0410   CHOLHDL 3.7 03/17/2020 0410   VLDL 11 03/17/2020 0410   LDLCALC 138 (H) 03/17/2020 0410   HgbA1c:  Lab Results  Component Value Date   HGBA1C 5.7  (H) 03/17/2020   Urine Drug Screen: No results found for: LABOPIA, COCAINSCRNUR, LABBENZ, AMPHETMU, THCU, LABBARB  Alcohol Level     Component Value Date/Time   ETH <10 03/16/2020 1209    IMAGING past 24h MR BRAIN WO CONTRAST  Result Date: 03/17/2020 CLINICAL DATA:  Follow-up stroke. Left-sided weakness and facial droop. Right MCA embolus with subsequent intervention. EXAM: MRI HEAD WITHOUT CONTRAST TECHNIQUE: Multiplanar, multiecho pulse sequences of the brain and surrounding structures were obtained without intravenous contrast. COMPARISON:  CT studies done yesterday. FINDINGS: Brain: Diffusion imaging shows low level restricted diffusion along the gyral surfaces the insula and right frontoparietal brain. Acute infarction is evident in the caudate and putamen on the right. Few small separate foci of acute infarction in the right occipital and posterior parietal region. Minimal cortical swelling but no mass effect. No evidence of hemorrhage. Single punctate focus susceptibility artifact in the right posterior temporal subcortical white matter.  No old ischemic changes are evident elsewhere. Vascular: Major vessels at the base of the brain show flow. Skull and upper cervical spine: Negative Sinuses/Orbits: Clear/normal Other: None IMPRESSION: Acute infarction affecting the caudate and putamen on the right. Lower level restricted diffusion affecting the cortical brain in the insula and frontoparietal region. Few other scattered small infarctions at the left posterior temporal/occipital junction and the left parietal white matter. No sign of hemorrhagic complication. No mass effect or shift. Electronically Signed   By: Paulina Fusi M.D.   On: 03/17/2020 11:22   ECHOCARDIOGRAM COMPLETE  Result Date: 03/17/2020    ECHOCARDIOGRAM REPORT   Patient Name:   Chelsea Trevino Date of Exam: 03/17/2020 Medical Rec #:  [id number]        Height:       64.0 in Accession #:    [id number]       Weight:       99.9 lb Date  of Birth:  1960-10-10         BSA:          1.456 m Patient Age:    59 years         BP:           94/65 mmHg Patient Gender: F                HR:           48 bpm. Exam Location:  Inpatient Procedure: 2D Echo, Cardiac Doppler and Color Doppler Indications:    Stroke  History:        Patient has no prior history of Echocardiogram examinations.                 Signs/Symptoms:Dizziness/Lightheadedness.  Sonographer:    Sheralyn Boatman RDCS Referring Phys: [id number] ERIC LINDZEN IMPRESSIONS  1. Left ventricular ejection fraction, by estimation, is 60 to 65%. The left ventricle has normal function. The left ventricle has no regional wall motion abnormalities. Left ventricular diastolic parameters were normal.  2. Right ventricular systolic function is normal. The right ventricular size is normal. There is mildly elevated pulmonary artery systolic pressure. The estimated right ventricular systolic pressure is 41.0 mmHg.  3. The mitral valve is normal in structure. Mild mitral valve regurgitation. No evidence of mitral stenosis.  4. Tricuspid valve regurgitation is mild to moderate.  5. The aortic valve is normal in structure. Aortic valve regurgitation is not visualized. No aortic stenosis is present.  6. The inferior vena cava is dilated in size with <50% respiratory variability, suggesting right atrial pressure of 15 mmHg. Conclusion(s)/Recommendation(s): No intracardiac source of embolism detected on this transthoracic study. A transesophageal echocardiogram is recommended to exclude cardiac source of embolism if clinically indicated. FINDINGS  Left Ventricle: Left ventricular ejection fraction, by estimation, is 60 to 65%. The left ventricle has normal function. The left ventricle has no regional wall motion abnormalities. The left ventricular internal cavity size was normal in size. There is  no left ventricular hypertrophy. Left ventricular diastolic parameters were normal. Right Ventricle: The right ventricular size is  normal. No increase in right ventricular wall thickness. Right ventricular systolic function is normal. There is mildly elevated pulmonary artery systolic pressure. The tricuspid regurgitant velocity is 2.55  m/s, and with an assumed right atrial pressure of 15 mmHg, the estimated right ventricular systolic pressure is 41.0 mmHg. Left Atrium: Left atrial size was normal in size. Right Atrium: Right atrial size was normal in size. Pericardium: There is no evidence  of pericardial effusion. Mitral Valve: The mitral valve is normal in structure. There is mild thickening of the mitral valve leaflet(s). Normal mobility of the mitral valve leaflets. Mild mitral valve regurgitation. No evidence of mitral valve stenosis. Tricuspid Valve: The tricuspid valve is normal in structure. Tricuspid valve regurgitation is mild to moderate. No evidence of tricuspid stenosis. Aortic Valve: The aortic valve is normal in structure. Aortic valve regurgitation is not visualized. No aortic stenosis is present. Pulmonic Valve: The pulmonic valve was normal in structure. Pulmonic valve regurgitation is not visualized. No evidence of pulmonic stenosis. Aorta: The aortic root is normal in size and structure. Venous: The inferior vena cava is dilated in size with less than 50% respiratory variability, suggesting right atrial pressure of 15 mmHg. IAS/Shunts: No atrial level shunt detected by color flow Doppler.  LEFT VENTRICLE PLAX 2D LVIDd:         4.30 cm     Diastology LVIDs:         2.70 cm     LV e' lateral:   10.40 cm/s LV PW:         1.10 cm     LV E/e' lateral: 6.1 LV IVS:        1.00 cm     LV e' medial:    9.57 cm/s LVOT diam:     2.00 cm     LV E/e' medial:  6.6 LV SV:         66 LV SV Index:   46 LVOT Area:     3.14 cm  LV Volumes (MOD) LV vol d, MOD A2C: 82.0 ml LV vol d, MOD A4C: 73.8 ml LV vol s, MOD A2C: 25.1 ml LV vol s, MOD A4C: 26.8 ml LV SV MOD A2C:     56.9 ml LV SV MOD A4C:     73.8 ml LV SV MOD BP:      52.5 ml RIGHT  VENTRICLE             IVC RV S prime:     12.30 cm/s  IVC diam: 2.80 cm TAPSE (M-mode): 3.3 cm LEFT ATRIUM             Index       RIGHT ATRIUM          Index LA diam:        3.10 cm 2.13 cm/m  RA Area:     9.94 cm LA Vol (A2C):   28.3 ml 19.43 ml/m RA Volume:   21.80 ml 14.97 ml/m LA Vol (A4C):   21.8 ml 14.97 ml/m LA Biplane Vol: 25.5 ml 17.51 ml/m  AORTIC VALVE LVOT Vmax:   85.50 cm/s LVOT Vmean:  51.700 cm/s LVOT VTI:    0.211 m  AORTA Ao Root diam: 3.00 cm Ao Asc diam:  3.50 cm MITRAL VALVE               TRICUSPID VALVE MV Area (PHT): 1.86 cm    TR Peak grad:   26.0 mmHg MV Decel Time: 408 msec    TR Vmax:        255.00 cm/s MV E velocity: 63.40 cm/s MV A velocity: 41.30 cm/s  SHUNTS MV E/A ratio:  1.54        Systemic VTI:  0.21 m                            Systemic Diam: 2.00 cm  Donato Schultz MD Electronically signed by Donato Schultz MD Signature Date/Time: 03/17/2020/2:49:25 PM    Final      PHYSICAL EXAM    GENERAL: Pleasant middle-age Caucasian lady not in distress. HEENT: Neck is supple no trauma noted.  ABDOMEN: soft  EXTREMITIES: No edema   BACK: Normal  SKIN: Normal by inspection.    MENTAL STATUS: Alert and oriented. Speech, language and cognition are generally intact. Judgment and insight normal.   CRANIAL NERVES: Pupils are equal, round and reactive to light and accomodation; extra ocular movements are full, there is no significant nystagmus; visual fields are full; upper and lower facial muscles are normal in strength and symmetric, there is no flattening of the nasolabial folds; tongue is midline; uvula is midline; shoulder elevation is normal.  MOTOR: Normal tone, bulk and strength; no pronator drift.  COORDINATION: Left finger to nose is normal, right finger to nose is normal, No rest tremor; no intention tremor; no postural tremor; no bradykinesia.  SENSATION: Normal to light touch, temperature, and pain.  There is extinction on double simultaneous stimulation on the  left side including the upper and lower extremities.  NIH stroke scale score is 0  ASSESSMENT/PLAN Chelsea Trevino is a 59 y.o. female with history of breast CA 10 years ago and recent cyst excised from the roof of her mouth presenting with acute onset left sided weakness, dizziness, unsteady gait, facial droop and slurred speech. The patient received IV t-PA Saturday 03/16/20 at 1245. Sent to IR for R MCA occlusion.   Stroke:  R MCA and small L posterior infarcts s/p tPA ands IR R MCA w/ TICI3 revascularization - embolic - source unknown, ? Endocarditis given recent dental infection ?  Hypercoagulability given family history of hyper homocystinemia  Code Stroke CT Head - no acute abnormality. hyperdense right MCA.  ASPECTS is 10   CTA H&N - R ICA terminus internal capsule. R MCA occlusion. R A1 severe stenosis with distal flow. Decreased R MCA flow.   Cerebral angio TICI3 revascularization proximal R M1 with x1 pass mm solitaire X retrieve and contact aspiration and prox flow arrest   Post IR CT head - R insular infarct, ? R parietal and temporal lobe infarct too.  MRI head - R caudate head and putamen infarct, insular and frontoparietal region too. Few scattered small L posterior temporal occipital  Lobe and L parietal white matter   2D Echo - EF 60-65%. No source of embolus    TEE to look for embolic source. Arranged with [facility] for tomorrow.  If positive for PFO (patent foramen ovale), check bilateral lower extremity venous dopplers to rule out DVT as possible source of stroke. (I have made patient NPO after midnight tonight).  If TEE negative, a [facility] electrophysiologist will consult and consider placement of an implantable loop recorder to evaluate for atrial fibrillation as etiology of stroke. This has been explained to patient/family by Dr. Pearlean Brownie and they are agreeable.   Sars Corona Virus 2 - negative  LDL -  138  HgbA1c - 5.7  UDS - pending    Hypercoagulable labs pending   VTE prophylaxis - SCDs. Change to lovenox  No antithrombotic prior to admission, now on No antithrombotic aspirin and Plavix for 3 weeks followed by aspirin alone therapy recommendations:  No therapy needs   Disposition:  Pending  Transfer to the floor once stable off pressor  Chronic Hypotension  Home BP meds: none  Treated w/ Cleviprex post IR, now off  Hypotension -> phenylephrine. 1 Liter NS bolus with NS IV 75 cc / hr. . BP low but stable, drops when off pressor . SBP>90 a long as stable  . Wean pressor . 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 Possibly more dental surgery. On PCN as OP. Will continue.   Hospital day # 2 Patient has done very well.  Mobilize out of bed.  Therapy consults.  Transfer to neurology floor bed.  Check hypercoagulable panel labs, transesophageal echocardiogram for cardiac source of embolism and consider loop recorder insertion at discharge.  Start aspirin Plavix for 3 weeks followed by aspirin alone and aggressive risk factor modification.  Greater than 50% time during this 35-minute visit was spent on counseling and coordination of care about her embolic stroke and discussion about evaluation treatment plan and answering questions. Delia HeadyPramod Manish Ruggiero, MD  To contact Stroke Continuity provider, please refer to [email or web address]. After hours, contact General Neurology

## 2020-03-19 ENCOUNTER — Inpatient Hospital Stay (HOSPITAL_COMMUNITY): Payer: No Typology Code available for payment source | Admitting: Certified Registered Nurse Anesthetist

## 2020-03-19 ENCOUNTER — Encounter (HOSPITAL_COMMUNITY)
Admission: EM | Disposition: A | Discharge status: Home / Self Care | Payer: Self-pay | Source: Home / Self Care | Attending: Neurology

## 2020-03-19 ENCOUNTER — Inpatient Hospital Stay (HOSPITAL_COMMUNITY): Payer: No Typology Code available for payment source

## 2020-03-19 ENCOUNTER — Encounter (HOSPITAL_COMMUNITY): Payer: Self-pay | Admitting: Neurology

## 2020-03-19 ENCOUNTER — Inpatient Hospital Stay (HOSPITAL_COMMUNITY)
Admission: EM | Admit: 2020-03-19 | Discharge: 2020-03-19 | Disposition: A | Discharge status: Home / Self Care | Payer: No Typology Code available for payment source | Source: Home / Self Care | Attending: Physician Assistant | Admitting: Physician Assistant

## 2020-03-19 DIAGNOSIS — Q211 Atrial septal defect: Secondary | ICD-10-CM

## 2020-03-19 DIAGNOSIS — I340 Nonrheumatic mitral (valve) insufficiency: Secondary | ICD-10-CM

## 2020-03-19 DIAGNOSIS — I639 Cerebral infarction, unspecified: Secondary | ICD-10-CM

## 2020-03-19 DIAGNOSIS — K047 Periapical abscess without sinus: Secondary | ICD-10-CM | POA: Diagnosis present

## 2020-03-19 DIAGNOSIS — I959 Hypotension, unspecified: Secondary | ICD-10-CM | POA: Diagnosis present

## 2020-03-19 DIAGNOSIS — E785 Hyperlipidemia, unspecified: Secondary | ICD-10-CM | POA: Diagnosis present

## 2020-03-19 DIAGNOSIS — Q2112 Patent foramen ovale: Secondary | ICD-10-CM

## 2020-03-19 HISTORY — PX: TEE WITHOUT CARDIOVERSION: SHX5443

## 2020-03-19 HISTORY — PX: BUBBLE STUDY: SHX6837

## 2020-03-19 HISTORY — PX: LOOP RECORDER INSERTION: EP1214

## 2020-03-19 LAB — BETA-2-GLYCOPROTEIN I ABS, IGG/M/A
Beta-2 Glyco I IgG: <9 GPI IgG units (ref 0–20)
Beta-2-Glycoprotein I IgA: <9 GPI IgA units (ref 0–25)
Beta-2-Glycoprotein I IgM: <9 GPI IgM units (ref 0–32)

## 2020-03-19 LAB — HOMOCYSTEINE: Homocysteine: 8.9 umol/L (ref 0.0–14.5)

## 2020-03-19 SURGERY — ECHOCARDIOGRAM, TRANSESOPHAGEAL
Anesthesia: Monitor Anesthesia Care

## 2020-03-19 SURGERY — LOOP RECORDER INSERTION

## 2020-03-19 MED ORDER — BUTAMBEN-TETRACAINE-BENZOCAINE 2-2-14 % EX AERO
INHALATION_SPRAY | CUTANEOUS | Status: DC | PRN
  Administered 2020-03-19: 2 via TOPICAL

## 2020-03-19 MED ORDER — CLOPIDOGREL BISULFATE 75 MG PO TABS: 75.0000 mg | ORAL_TABLET | Freq: Every day | ORAL | 0 refills | Status: DC

## 2020-03-19 MED ORDER — PROPOFOL 10 MG/ML IV BOLUS
INTRAVENOUS | Status: DC | PRN
  Administered 2020-03-19 (×2): 20 mg via INTRAVENOUS

## 2020-03-19 MED ORDER — LIDOCAINE 2% (20 MG/ML) 5 ML SYRINGE
INTRAMUSCULAR | Status: DC | PRN
Start: 2020-03-19 — End: 2020-03-19
  Administered 2020-03-19: 50 mg via INTRAVENOUS

## 2020-03-19 MED ORDER — PROPOFOL 500 MG/50ML IV EMUL
INTRAVENOUS | Status: DC | PRN
  Administered 2020-03-19: 70 ug/kg/min via INTRAVENOUS

## 2020-03-19 MED ORDER — ASPIRIN 81 MG PO TBEC: 81.0000 mg | DELAYED_RELEASE_TABLET | Freq: Every day | ORAL | 11 refills | Status: AC

## 2020-03-19 MED ORDER — ATORVASTATIN CALCIUM 80 MG PO TABS: 80.0000 mg | ORAL_TABLET | Freq: Every day | ORAL | 2 refills | Status: DC

## 2020-03-19 MED ORDER — LACTATED RINGERS IV SOLN
INTRAVENOUS | Status: DC | PRN
Start: 2020-03-19 — End: 2020-03-19

## 2020-03-19 MED ORDER — LIDOCAINE-EPINEPHRINE 1 %-1:100000 IJ SOLN
INTRAMUSCULAR | Status: DC | PRN
  Administered 2020-03-19: 7 mL

## 2020-03-19 MED ORDER — LIDOCAINE-EPINEPHRINE 1 %-1:100000 IJ SOLN
INTRAMUSCULAR | Status: AC
  Filled 2020-03-19: qty 1

## 2020-03-19 MED FILL — ASPIRIN LOW DOSE 81 MG TBEC: 81 | 30 days supply | Qty: 30 | Fill #0

## 2020-03-19 MED FILL — CLOPIDOGREL 75 MG TABLET: 75 | 21 days supply | Qty: 21 | Fill #0

## 2020-03-19 MED FILL — ATORVASTATIN CALCIUM 80 MG: 80 | 30 days supply | Qty: 30 | Fill #0

## 2020-03-19 SURGICAL SUPPLY — 2 items
MONITOR REVEAL LINQ II (Prosthesis & Implant Heart) ×2 IMPLANT
PACK LOOP INSERTION (CUSTOM PROCEDURE TRAY) ×3 IMPLANT

## 2020-03-19 NOTE — Discharge Instructions (Addendum)
Heart monitor implant site/wound care instructions Keep incision clean and dry for 3 days. You can remove outer dressing tomorrow. Leave steri-strips (little pieces of tape) on until seen in the office for wound check appointment. Call the office [phone]) for redness, drainage, swelling, or fever.   Femoral Site Care This sheet gives you information about how to care for yourself after your procedure. Your health care provider may also give you more specific instructions. If you have problems or questions, contact your health care provider. What can I expect after the procedure? After the procedure, it is common to have:  Bruising that usually fades within 1-2 weeks.  Tenderness at the site. Follow these instructions at home: Wound care 1. Follow instructions from your health care provider about how to take care of your insertion site. Make sure you: ? Wash your hands with soap and water before you change your bandage (dressing). If soap and water are not available, use hand sanitizer. ? Change your dressing as directed- pressure dressing removed 24 hours post-procedure (and switch for bandaid), bandaid removed 72 hours post-procedure 2. Do not take baths, swim, or use a hot tub for 7 days post-procedure. 3. You may shower 48 hours after the procedure or as told by your health care provider. ? Gently wash the site with plain soap and water. ? Pat the area dry with a clean towel. ? Do not rub the site. This may cause bleeding. 4. Check your femoral site every day for signs of infection. Check for: ? Redness, swelling, or pain. ? Fluid or blood. ? Warmth. ? Pus or a bad smell. Activity  Do not stoop, bend, or lift anything that is heavier than 10 lb (4.5 kg) for 2 weeks post-procedure.  Do not drive self for 2 weeks post-procedure. Contact a health care provider if you have:  A fever or chills.  You have redness, swelling, or pain around your insertion site. Get help right  away if:  The catheter insertion area swells very fast.  You pass out.  You suddenly start to sweat or your skin gets clammy.  The catheter insertion area is bleeding, and the bleeding does not stop when you hold steady pressure on the area.  The area near or just beyond the catheter insertion site becomes pale, cool, tingly, or numb. These symptoms may represent a serious problem that is an emergency. Do not wait to see if the symptoms will go away. Get medical help right away. Call your local emergency services (911 in the [place] Do not drive yourself to the hospital.  This information is not intended to replace advice given to you by your health care provider. Make sure you discuss any questions you have with your health care provider. Document Revised: 08/16/2017 Document Reviewed: 08/16/2017 Elsevier Patient Education  2020 [employer].

## 2020-03-19 NOTE — Progress Notes (Signed)
IV removed without complication. Discharge instructions reviewed with patient and patient verbalized understanding. Patient received home meds from pharmacy. Patient discharged via wheelchair to private vehicle driven by friend.

## 2020-03-19 NOTE — Anesthesia Preprocedure Evaluation (Signed)
Anesthesia Evaluation  Patient identified by MRN, date of birth, ID band Patient awake    Reviewed: Allergy & Precautions, NPO status , Patient's Chart, lab work & pertinent test results  History of Anesthesia Complications Negative for: history of anesthetic complications  Airway Mallampati: II  TM Distance: >3 FB Neck ROM: Full    Dental   Pulmonary Current Smoker,    Pulmonary exam normal        Cardiovascular negative cardio ROS Normal cardiovascular exam     Neuro/Psych CVA negative psych ROS   GI/Hepatic negative GI ROS, Neg liver ROS,   Endo/Other  negative endocrine ROS  Renal/GU negative Renal ROS  negative genitourinary   Musculoskeletal negative musculoskeletal ROS (+)   Abdominal   Peds  Hematology negative hematology ROS (+)   Anesthesia Other Findings   Reproductive/Obstetrics                            Anesthesia Physical Anesthesia Plan  ASA: IV  Anesthesia Plan: MAC   Post-op Pain Management:    Induction: Intravenous  PONV Risk Score and Plan: 2 and Propofol infusion, TIVA and Treatment may vary due to age or medical condition  Airway Management Planned: Natural Airway, Nasal Cannula and Simple Face Mask  Additional Equipment: None  Intra-op Plan:   Post-operative Plan:   Informed Consent: I have reviewed the patients History and Physical, chart, labs and discussed the procedure including the risks, benefits and alternatives for the proposed anesthesia with the patient or authorized representative who has indicated his/her understanding and acceptance.       Plan Discussed with:   Anesthesia Plan Comments:         Anesthesia Quick Evaluation

## 2020-03-19 NOTE — Discharge Summary (Addendum)
Stroke Discharge Summary  Patient ID: Kurstin Dimarzo   MRN: [id number]      DOB: 15-Jul-1961  Date of Admission: 03/16/2020 Date of Discharge: 03/19/2020  Attending Physician:  Micki Riley, MD, Stroke MD Consultant(s):   Roxanne Gates) Corliss Skains, MD (Interventional Neuroradiologist), Saunders Revel, MD ( cardiology - TEE), Hillis Range, MD (electrophysiology - loop)  Patient's PCP:  Patient, No Pcp Per  DISCHARGE DIAGNOSIS:  Principal Problem:   Cryptogenic stroke (HCC) R MCA and L PCA s/p tPA and mechanical thrombectomy R MCA Active Problems:   Middle cerebral artery embolism, right   PFO (patent foramen ovale)   Hypotension   Hyperlipidemia   Dental infection   Allergies as of 03/19/2020       Reactions   Codeine Other (See Comments)   Other Reaction: Nausea & Vomiting   Latex Rash        Medication List     TAKE these medications    aspirin 81 MG EC tablet Take 1 tablet (81 mg total) by mouth daily. Swallow whole. Start taking on: March 20, 2020   atorvastatin 80 MG tablet Commonly known as: LIPITOR Take 1 tablet (80 mg total) by mouth daily at 6 PM.   clopidogrel 75 MG tablet Commonly known as: PLAVIX Take 1 tablet (75 mg total) by mouth daily. Start taking on: March 20, 2020   penicillin v potassium 500 MG tablet Commonly known as: VEETID Take 500 mg by mouth 4 (four) times daily.        LABORATORY STUDIES CBC    Component Value Date/Time   WBC 9.2 03/17/2020 0410   RBC 3.71 (L) 03/17/2020 0410   HGB 12.0 03/17/2020 0410   HCT 36.5 03/17/2020 0410   PLT 252 03/17/2020 0410   MCV 98.4 03/17/2020 0410   MCH 32.3 03/17/2020 0410   MCHC 32.9 03/17/2020 0410   RDW 12.2 03/17/2020 0410   LYMPHSABS 1.0 03/17/2020 0410   MONOABS 0.4 03/17/2020 0410   EOSABS 0.0 03/17/2020 0410   BASOSABS 0.0 03/17/2020 0410   CMP    Component Value Date/Time   NA 141 03/17/2020 0410   K 4.1 03/17/2020 0410   CL 111 03/17/2020 0410   CO2 22 03/17/2020  0410   GLUCOSE 139 (H) 03/17/2020 0410   BUN 7 03/17/2020 0410   CREATININE 0.66 03/17/2020 0410   CALCIUM 8.4 (L) 03/17/2020 0410   PROT 5.7 (L) 03/16/2020 1209   ALBUMIN 3.4 (L) 03/16/2020 1209   AST 17 03/16/2020 1209   ALT 19 03/16/2020 1209   ALKPHOS 59 03/16/2020 1209   BILITOT 0.7 03/16/2020 1209   GFRNONAA >60 03/17/2020 0410   GFRAA >60 03/17/2020 0410   COAGS Lab Results  Component Value Date   INR 1.0 03/16/2020   Lipid Panel    Component Value Date/Time   CHOL 205 (H) 03/17/2020 0410   TRIG 54 03/17/2020 0410   HDL 56 03/17/2020 0410   CHOLHDL 3.7 03/17/2020 0410   VLDL 11 03/17/2020 0410   LDLCALC 138 (H) 03/17/2020 0410   HgbA1C  Lab Results  Component Value Date   HGBA1C 5.7 (H) 03/17/2020   Alcohol Level    Component Value Date/Time   ETH <10 03/16/2020 1209    SIGNIFICANT DIAGNOSTIC STUDIES CT HEAD WO CONTRAST  Result Date: 03/16/2020 CLINICAL DATA:  Stroke, follow-up.  Follow-up CVA post intervention. EXAM: CT HEAD WITHOUT CONTRAST TECHNIQUE: Contiguous axial images were obtained from the base of the skull through  the vertex without intravenous contrast. COMPARISON:  Noncontrast head CT and CT angiogram head/neck performed earlier the same day 03/16/2020. FINDINGS: Brain: Cerebral volume is normal. There is no evidence of acute intracranial hemorrhage. There is abnormal hypodensity consistent with acute infarction within the right insula. Subtle loss of gray-white differentiation is also questioned within portions of the right parietal and temporal lobes. No extra-axial fluid collection. No evidence of intracranial mass. No midline shift. Vascular: Residual circulating contrast material limits evaluation for hyperdense vessels. Skull: Normal. Negative for fracture or focal lesion. Sinuses/Orbits: Visualized orbits show no acute finding. Paranasal sinus mucosal thickening. Most notably, moderate mucosal thickening is present within the ethmoid air cells. No  significant mastoid effusion IMPRESSION: Acute ischemic infarction changes within the right insula. Subtle changes of acute ischemic infarction are also questioned within portions of the right parietal and temporal lobes. No evidence of acute intracranial hemorrhage. Residual circulating contrast material. Paranasal sinus disease as described. Electronically Signed   By: Jackey Loge DO   On: 03/16/2020 14:56   MR BRAIN WO CONTRAST  Result Date: 03/17/2020 CLINICAL DATA:  Follow-up stroke. Left-sided weakness and facial droop. Right MCA embolus with subsequent intervention. EXAM: MRI HEAD WITHOUT CONTRAST TECHNIQUE: Multiplanar, multiecho pulse sequences of the brain and surrounding structures were obtained without intravenous contrast. COMPARISON:  CT studies done yesterday. FINDINGS: Brain: Diffusion imaging shows low level restricted diffusion along the gyral surfaces the insula and right frontoparietal brain. Acute infarction is evident in the caudate and putamen on the right. Few small separate foci of acute infarction in the right occipital and posterior parietal region. Minimal cortical swelling but no mass effect. No evidence of hemorrhage. Single punctate focus susceptibility artifact in the right posterior temporal subcortical white matter. No old ischemic changes are evident elsewhere. Vascular: Major vessels at the base of the brain show flow. Skull and upper cervical spine: Negative Sinuses/Orbits: Clear/normal Other: None IMPRESSION: Acute infarction affecting the caudate and putamen on the right. Lower level restricted diffusion affecting the cortical brain in the insula and frontoparietal region. Few other scattered small infarctions at the left posterior temporal/occipital junction and the left parietal white matter. No sign of hemorrhagic complication. No mass effect or shift. Electronically Signed   By: Paulina Fusi M.D.   On: 03/17/2020 11:22   ECHOCARDIOGRAM COMPLETE  Result Date:  03/17/2020    ECHOCARDIOGRAM REPORT   Patient Name:   NYAZIA CANEVARI Date of Exam: 03/17/2020 Medical Rec #:  [id number]        Height:       64.0 in Accession #:    [id number]       Weight:       99.9 lb Date of Birth:  Apr 14, 1961         BSA:          1.456 m Patient Age:    59 years         BP:           94/65 mmHg Patient Gender: F                HR:           48 bpm. Exam Location:  Inpatient Procedure: 2D Echo, Cardiac Doppler and Color Doppler Indications:    Stroke  History:        Patient has no prior history of Echocardiogram examinations.                 Signs/Symptoms:Dizziness/Lightheadedness.  Sonographer:  Sheralyn Boatman RDCS Referring Phys: [id number] ERIC LINDZEN IMPRESSIONS  1. Left ventricular ejection fraction, by estimation, is 60 to 65%. The left ventricle has normal function. The left ventricle has no regional wall motion abnormalities. Left ventricular diastolic parameters were normal.  2. Right ventricular systolic function is normal. The right ventricular size is normal. There is mildly elevated pulmonary artery systolic pressure. The estimated right ventricular systolic pressure is 41.0 mmHg.  3. The mitral valve is normal in structure. Mild mitral valve regurgitation. No evidence of mitral stenosis.  4. Tricuspid valve regurgitation is mild to moderate.  5. The aortic valve is normal in structure. Aortic valve regurgitation is not visualized. No aortic stenosis is present.  6. The inferior vena cava is dilated in size with <50% respiratory variability, suggesting right atrial pressure of 15 mmHg. Conclusion(s)/Recommendation(s): No intracardiac source of embolism detected on this transthoracic study. A transesophageal echocardiogram is recommended to exclude cardiac source of embolism if clinically indicated. FINDINGS  Left Ventricle: Left ventricular ejection fraction, by estimation, is 60 to 65%. The left ventricle has normal function. The left ventricle has no regional wall motion abnormalities.  The left ventricular internal cavity size was normal in size. There is  no left ventricular hypertrophy. Left ventricular diastolic parameters were normal. Right Ventricle: The right ventricular size is normal. No increase in right ventricular wall thickness. Right ventricular systolic function is normal. There is mildly elevated pulmonary artery systolic pressure. The tricuspid regurgitant velocity is 2.55  m/s, and with an assumed right atrial pressure of 15 mmHg, the estimated right ventricular systolic pressure is 41.0 mmHg. Left Atrium: Left atrial size was normal in size. Right Atrium: Right atrial size was normal in size. Pericardium: There is no evidence of pericardial effusion. Mitral Valve: The mitral valve is normal in structure. There is mild thickening of the mitral valve leaflet(s). Normal mobility of the mitral valve leaflets. Mild mitral valve regurgitation. No evidence of mitral valve stenosis. Tricuspid Valve: The tricuspid valve is normal in structure. Tricuspid valve regurgitation is mild to moderate. No evidence of tricuspid stenosis. Aortic Valve: The aortic valve is normal in structure. Aortic valve regurgitation is not visualized. No aortic stenosis is present. Pulmonic Valve: The pulmonic valve was normal in structure. Pulmonic valve regurgitation is not visualized. No evidence of pulmonic stenosis. Aorta: The aortic root is normal in size and structure. Venous: The inferior vena cava is dilated in size with less than 50% respiratory variability, suggesting right atrial pressure of 15 mmHg. IAS/Shunts: No atrial level shunt detected by color flow Doppler.  LEFT VENTRICLE PLAX 2D LVIDd:         4.30 cm     Diastology LVIDs:         2.70 cm     LV e' lateral:   10.40 cm/s LV PW:         1.10 cm     LV E/e' lateral: 6.1 LV IVS:        1.00 cm     LV e' medial:    9.57 cm/s LVOT diam:     2.00 cm     LV E/e' medial:  6.6 LV SV:         66 LV SV Index:   46 LVOT Area:     3.14 cm  LV Volumes  (MOD) LV vol d, MOD A2C: 82.0 ml LV vol d, MOD A4C: 73.8 ml LV vol s, MOD A2C: 25.1 ml LV vol s, MOD A4C: 26.8 ml  LV SV MOD A2C:     56.9 ml LV SV MOD A4C:     73.8 ml LV SV MOD BP:      52.5 ml RIGHT VENTRICLE             IVC RV S prime:     12.30 cm/s  IVC diam: 2.80 cm TAPSE (M-mode): 3.3 cm LEFT ATRIUM             Index       RIGHT ATRIUM          Index LA diam:        3.10 cm 2.13 cm/m  RA Area:     9.94 cm LA Vol (A2C):   28.3 ml 19.43 ml/m RA Volume:   21.80 ml 14.97 ml/m LA Vol (A4C):   21.8 ml 14.97 ml/m LA Biplane Vol: 25.5 ml 17.51 ml/m  AORTIC VALVE LVOT Vmax:   85.50 cm/s LVOT Vmean:  51.700 cm/s LVOT VTI:    0.211 m  AORTA Ao Root diam: 3.00 cm Ao Asc diam:  3.50 cm MITRAL VALVE               TRICUSPID VALVE MV Area (PHT): 1.86 cm    TR Peak grad:   26.0 mmHg MV Decel Time: 408 msec    TR Vmax:        255.00 cm/s MV E velocity: 63.40 cm/s MV A velocity: 41.30 cm/s  SHUNTS MV E/A ratio:  1.54        Systemic VTI:  0.21 m                            Systemic Diam: 2.00 cm Donato Schultz MD Electronically signed by Donato Schultz MD Signature Date/Time: 03/17/2020/2:49:25 PM    Final    ECHO TEE  Result Date: 03/19/2020    TRANSESOPHOGEAL ECHO REPORT   Patient Name:   PRANATHI WINFREE Date of Exam: 03/19/2020 Medical Rec #:  [id number]        Height:       67.0 in Accession #:    [id number]       Weight:       133.0 lb Date of Birth:  1961-03-12         BSA:          1.700 m Patient Age:    59 years         BP:           96/72 mmHg Patient Gender: F                HR:           68 bpm. Exam Location:  Inpatient Procedure: Transesophageal Echo, Color Doppler, Cardiac Doppler, 3D Echo and            Saline Contrast Bubble Study Indications:     Stroke  History:         Patient has prior history of Echocardiogram examinations, most                  recent 03/17/2020. Signs/Symptoms:Chronic Hypotension; Risk                  Factors:Dyslipidemia. Bradycardia.  Sonographer:     Leta Jungling RDCS Referring Phys:   [id number] HAO MENG Diagnosing Phys: Chilton Si MD PROCEDURE: The transesophogeal probe was passed without difficulty through the esophogus of the patient. Local oropharyngeal anesthetic  was provided with viscous lidocaine. Sedation performed by different physician. The patient was monitored while under deep sedation. Anesthestetic sedation was provided intravenously by Anesthesiology: 45mg  of Propofol. Image quality was excellent. The patient's vital signs; including heart rate, blood pressure, and oxygen saturation; remained stable throughout the procedure. The patient developed no complications during the procedure. IMPRESSIONS  1. Left ventricular ejection fraction, by estimation, is 60 to 65%. The left ventricle has normal function. The left ventricle has no regional wall motion abnormalities.  2. Right ventricular systolic function is normal. The right ventricular size is normal.  3. No left atrial/left atrial appendage thrombus was detected.  4. The mitral valve is normal in structure. Mild mitral valve regurgitation. No evidence of mitral stenosis.  5. The aortic valve is tricuspid. Aortic valve regurgitation is trivial. No aortic stenosis is present.  6. Evidence of atrial level shunting detected by color flow Doppler. Agitated saline contrast bubble study was positive with shunting observed within 3-6 cardiac cycles suggestive of interatrial shunt. There is a moderately sized patent foramen ovale with predominantly right to left shunting across the atrial septum. Conclusion(s)/Recommendation(s): Normal biventricular function without evidence of hemodynamically significant valvular heart disease. FINDINGS  Left Ventricle: Left ventricular ejection fraction, by estimation, is 60 to 65%. The left ventricle has normal function. The left ventricle has no regional wall motion abnormalities. The left ventricular internal cavity size was normal in size. There is  no left ventricular hypertrophy. Right  Ventricle: The right ventricular size is normal. No increase in right ventricular wall thickness. Right ventricular systolic function is normal. Left Atrium: Left atrial size was normal in size. No left atrial/left atrial appendage thrombus was detected. Right Atrium: Right atrial size was normal in size. Pericardium: There is no evidence of pericardial effusion. Mitral Valve: The mitral valve is normal in structure. Normal mobility of the mitral valve leaflets. Mild mitral valve regurgitation. No evidence of mitral valve stenosis. Tricuspid Valve: The tricuspid valve is normal in structure. Tricuspid valve regurgitation is mild . No evidence of tricuspid stenosis. Aortic Valve: The aortic valve is tricuspid. Aortic valve regurgitation is trivial. No aortic stenosis is present. Pulmonic Valve: The pulmonic valve was normal in structure. Pulmonic valve regurgitation is trivial. No evidence of pulmonic stenosis. Aorta: The aortic root is normal in size and structure. There is minimal (Grade I) layered plaque involving the descending aorta. IAS/Shunts: Evidence of atrial level shunting detected by color flow Doppler. Agitated saline contrast was given intravenously to evaluate for intracardiac shunting. Agitated saline contrast bubble study was positive with shunting observed within 3-6 cardiac cycles suggestive of interatrial shunt. A moderately sized patent foramen ovale is detected with predominantly right to left shunting across the atrial septum.  TRICUSPID VALVE TR Peak grad:   20.4 mmHg TR Vmax:        226.00 cm/s Chilton Siiffany [place] MD Electronically signed by Chilton Siiffany [place] MD Signature Date/Time: 03/19/2020/11:37:27 AM    Final    IR PERCUTANEOUS ART THROMBECTOMY/INFUSION INTRACRANIAL INC DIAG ANGIO  Result Date: 03/19/2020 INDICATION: New onset left-sided weakness with left-sided neglect. Occluded right middle cerebral artery on CT angiogram of the head and neck. EXAM: 1. EMERGENT LARGE VESSEL OCCLUSION  THROMBOLYSIS anterior CIRCULATION) COMPARISON:  CT angiogram of the head and neck of March 16, 2020. MEDICATIONS: Ancef 2 g IV antibiotic was administered within 1 hour of the procedure. ANESTHESIA/SEDATION: General anesthesia CONTRAST:  Isovue 300 approximately 65 mL FLUOROSCOPY TIME:  Fluoroscopy Time: 10 minutes 24 seconds (602 mGy). COMPLICATIONS:  None immediate. TECHNIQUE: Following a full explanation of the procedure along with the potential associated complications, emergent 2 physician consent was obtained. The risks of intracranial hemorrhage of 10%, worsening neurological deficit, ventilator dependency, death and inability to revascularize were known. The patient was then put under general anesthesia by the Department of Anesthesiology at [facility]. The right groin was prepped and draped in the usual sterile fashion. Thereafter using modified Seldinger technique, transfemoral access into the right common femoral artery was obtained without difficulty. Over a 0.035 inch guidewire a 8 French 25 cm Pinnacle sheath was inserted. Through this, and also over a 0.035 inch guidewire a 5 [place] JB 1 catheter was advanced to the aortic arch region and selectively positioned in the innominate artery and the right common carotid artery. FINDINGS: The innominate arteriogram demonstrates the right subclavian artery and the right common carotid artery origins to be widely patent. The right common carotid bifurcation demonstrates the right external carotid artery and its major branches to be widely patent. The right internal carotid artery at the bulb to the cranial skull base demonstrates wide patency. The petrous, cavernous and supraclinoid segments opacify widely. The right posterior communicating artery is seen opacifying the right posterior cerebral distribution. Complete angiographic occlusion of the right middle cerebral artery proximal M1 was confirmed. PROCEDURE: The diagnostic JB 1 catheter in right  common carotid artery was exchanged over a 0.035 inch 300 cm Rosen exchange guidewire for an 087 balloon guide catheter which was advanced to the distal right internal carotid artery over the wire. The wire was removed. Good aspiration obtained from the hub of the balloon guide catheter. A gentle control arteriogram performed through this demonstrated no evidence of spasms, dissections or of intraluminal filling defects. Over a 0.014 inch standard Synchro micro guidewire with a J-tip configuration, a combination of an 021 Trevo ProVue microcatheter inside of an 071 135 cm Zoom aspiration catheter was advanced to the supraclinoid right ICA. The micro guidewire was then gently manipulated with a torque device and advanced into the inferior division of the right middle cerebral artery M2 M3 region followed by the microcatheter. The guidewire was removed. Good aspiration obtained from the hub of the microcatheter. Gentle contrast injection demonstrated safe position of tip of the microcatheter, which was then connected to continuous heparinized saline infusion. A 4 mm 40 mm Solitaire X retrieval device was advanced to the distal end of the microcatheter and deployed in the usual manner. The 071 Zoom catheter was then engaged into the clot within the mid M1 segment. With proximal flow arrest in the right internal carotid artery, constant aspiration was applied at the hub of the 071 Zoom catheter via a Penumbra aspiration pump as aspiration was also performed at the hub of the balloon guide catheter at its hub with a 60 mL syringe for approximately 2-1/2 minutes. Thereafter, the combination of the retrieval device, the microcatheter and the 071 Zoom aspiration catheter were retrieved and removed. Clot was seen entangled within the retrieval device, and also within the Zoom catheter. Control arteriogram performed with proximal flow reversal demonstrated complete angiographic revascularization of the right MCA distribution  with patency of the right anterior cerebral artery within normal limits. Patency of the right posterior communicating artery was also noted to be present. A TICI 3 revascularization was achieved of the right middle cerebral artery distribution. Mild to moderate spasm of the right middle cerebral artery inferior division responded to 25 mcg of nitroglycerin intra-arterially. A final control arteriogram  performed through the balloon guide in the right common carotid artery demonstrated wide patency of the right internal carotid artery extra cranially and intracranially. No evidence of intraluminal filling defects or intracranial occlusion was seen. Throughout the procedure, the patient's blood pressure and neurological status remained stable. Right groin hemostasis was achieved with an 8 French Angio-Seal closure device. Distal pulses remained palpable in both feet unchanged. A post procedure CT scan of the brain demonstrated hypo attenuation in the right putamen region, and also the right parietal subcortical region. No mass-effect or midline shift was seen. The patient was then extubated without difficulty. Upon recovery, the patient was able to obey simple commands appropriately, was oriented to place. She was able to move all fours almost equally to command. She was then transferred to neuro ICU for post thrombectomy management. IMPRESSION: Status post endovascular complete revascularization of right middle cerebral artery M1 segment with 1 pass with the 4 mm x 40 mm Solitaire X retrieval device, contact aspiration, and proximal flow arrest achieving a TICI 3 revascularization. PLAN: Follow-up in the clinic in 4 weeks. Electronically Signed   By: Julieanne Cotton M.D.   On: 03/18/2020 11:35   CT HEAD CODE STROKE WO CONTRAST  Result Date: 03/16/2020 CLINICAL DATA:  Code stroke.  Left-sided weakness and facial droop. EXAM: CT HEAD WITHOUT CONTRAST TECHNIQUE: Contiguous axial images were obtained from the base  of the skull through the vertex without intravenous contrast. COMPARISON:  None. FINDINGS: Brain: Brain parenchyma itself appears normal. No early ischemic signs. No evidence of old infarction. No hemorrhage, hydrocephalus or extra-axial collection. Vascular: Definite long segment hyperdense right MCA consistent with acute embolic occlusion. Skull: Normal Sinuses/Orbits: Clear Other: None ASPECTS ([place] Stroke Program Early CT Score) - Ganglionic level infarction (caudate, lentiform nuclei, internal capsule, insula, M1-M3 cortex): 7 - Supraganglionic infarction (M4-M6 cortex): 3 Total score (0-10 with 10 being normal): 10 IMPRESSION: 1. Brain appears normal at this time. However, there is definite long segment hyperdense right MCA consistent with embolic occlusion. 2. ASPECTS is 10 3. Findings discussed with Dr. Otelia Limes during interpretation at 1220 hours. Electronically Signed   By: Paulina Fusi M.D.   On: 03/16/2020 12:23   VAS [place] LOWER EXTREMITY VENOUS (DVT)  Result Date: 03/19/2020  Lower Venous DVTStudy Indications: Stroke, and PFO.  Comparison Study: No prior studies. Performing Technologist: Jean Rosenthal  Examination Guidelines: A complete evaluation includes B-mode imaging, spectral Doppler, color Doppler, and power Doppler as needed of all accessible portions of each vessel. Bilateral testing is considered an integral part of a complete examination. Limited examinations for reoccurring indications may be performed as noted. The reflux portion of the exam is performed with the patient in reverse Trendelenburg.  +---------+---------------+---------+-----------+----------+--------------+ RIGHT    CompressibilityPhasicitySpontaneityPropertiesThrombus Aging +---------+---------------+---------+-----------+----------+--------------+ CFV      Full           Yes      Yes                                 +---------+---------------+---------+-----------+----------+--------------+ SFJ      Full                                                         +---------+---------------+---------+-----------+----------+--------------+ FV Prox  Full                                                        +---------+---------------+---------+-----------+----------+--------------+  FV Mid   Full                                                        +---------+---------------+---------+-----------+----------+--------------+ FV DistalFull                                                        +---------+---------------+---------+-----------+----------+--------------+ PFV      Full                                                        +---------+---------------+---------+-----------+----------+--------------+ POP      Full           Yes      Yes                                 +---------+---------------+---------+-----------+----------+--------------+ PTV      Full                                                        +---------+---------------+---------+-----------+----------+--------------+ PERO     Full                                                        +---------+---------------+---------+-----------+----------+--------------+   +---------+---------------+---------+-----------+----------+--------------+ LEFT     CompressibilityPhasicitySpontaneityPropertiesThrombus Aging +---------+---------------+---------+-----------+----------+--------------+ CFV      Full           Yes      Yes                                 +---------+---------------+---------+-----------+----------+--------------+ SFJ      Full                                                        +---------+---------------+---------+-----------+----------+--------------+ FV Prox  Full                                                        +---------+---------------+---------+-----------+----------+--------------+ FV Mid   Full                                                         +---------+---------------+---------+-----------+----------+--------------+  FV DistalFull                                                        +---------+---------------+---------+-----------+----------+--------------+ PFV      Full                                                        +---------+---------------+---------+-----------+----------+--------------+ POP      Full           Yes      Yes                                 +---------+---------------+---------+-----------+----------+--------------+ PTV      Full                                                        +---------+---------------+---------+-----------+----------+--------------+ PERO     Full                                                        +---------+---------------+---------+-----------+----------+--------------+     Summary: RIGHT: - There is no evidence of deep vein thrombosis in the lower extremity.  - No cystic structure found in the popliteal fossa.  LEFT: - There is no evidence of deep vein thrombosis in the lower extremity.  - No cystic structure found in the popliteal fossa.  *See table(s) above for measurements and observations.    Preliminary    CT ANGIO HEAD CODE STROKE  Result Date: 03/16/2020 CLINICAL DATA:  Acute left-sided weakness, slurred speech and facial droop. EXAM: CT ANGIOGRAPHY HEAD AND NECK TECHNIQUE: Multidetector CT imaging of the head and neck was performed using the standard protocol during bolus administration of intravenous contrast. Multiplanar CT image reconstructions and MIPs were obtained to evaluate the vascular anatomy. Carotid stenosis measurements (when applicable) are obtained utilizing NASCET criteria, using the distal internal carotid diameter as the denominator. CONTRAST:  Not annotated COMPARISON:  Head CT earlier same day FINDINGS: CTA NECK FINDINGS Aortic arch: Mild atherosclerosis of the aortic arch. Branching pattern is normal without origin  stenosis. Right carotid system: Common carotid artery widely patent to the carotid bifurcation. Carotid bifurcation is normal without soft or calcified plaque. Cervical ICA widely patent. Left carotid system: Left common carotid artery widely patent to the bifurcation. Carotid bifurcation is normal without soft or calcified plaque. Cervical ICA is normal. Vertebral arteries: Both vertebral artery origins widely patent. Both vertebral arteries widely patent through the foramen magnum. Skeleton: Mid cervical spondylosis. Other neck: No mass or lymphadenopathy. Upper chest: Normal Review of the MIP images confirms the above findings CTA HEAD FINDINGS Anterior circulation: Left internal carotid artery widely patent through the skull base and siphon region. Normal filling of the left anterior and middle cerebral branches. Right  supraclinoid ICA occlusion at the carotid terminus with occlusion of the right middle cerebral artery. Stenosis of the A1 segment indicating that some clot extends into that vessel, but there is more distal filling probably due 2 some coexistent flow as well as a patent anterior communicating artery. One can appreciate markedly diminished flow throughout the right middle cerebral artery territory. Posterior circulation: Both vertebral arteries widely patent to the basilar. No basilar stenosis. Posterior circulation branch vessels are normal. Large posterior communicating artery on the right. Venous sinuses: Patent and normal. Anatomic variants: None significant. Review of the MIP images confirms the above findings IMPRESSION: Embolic occlusion at the right carotid terminus. Right MCA occlusion. Severe stenosis of the right A1 segment but with distal flow. Markedly diminished flow evident throughout the right middle cerebral artery territory. No evidence of hemorrhage or swelling at this point in time. Carotid bifurcations widely patent. Findings discussed with Dr. Otelia Limes during interpretation at  1234 hours. Electronically Signed   By: Paulina Fusi M.D.   On: 03/16/2020 12:39   CT ANGIO NECK CODE STROKE  Result Date: 03/16/2020 CLINICAL DATA:  Acute left-sided weakness, slurred speech and facial droop. EXAM: CT ANGIOGRAPHY HEAD AND NECK TECHNIQUE: Multidetector CT imaging of the head and neck was performed using the standard protocol during bolus administration of intravenous contrast. Multiplanar CT image reconstructions and MIPs were obtained to evaluate the vascular anatomy. Carotid stenosis measurements (when applicable) are obtained utilizing NASCET criteria, using the distal internal carotid diameter as the denominator. CONTRAST:  Not annotated COMPARISON:  Head CT earlier same day FINDINGS: CTA NECK FINDINGS Aortic arch: Mild atherosclerosis of the aortic arch. Branching pattern is normal without origin stenosis. Right carotid system: Common carotid artery widely patent to the carotid bifurcation. Carotid bifurcation is normal without soft or calcified plaque. Cervical ICA widely patent. Left carotid system: Left common carotid artery widely patent to the bifurcation. Carotid bifurcation is normal without soft or calcified plaque. Cervical ICA is normal. Vertebral arteries: Both vertebral artery origins widely patent. Both vertebral arteries widely patent through the foramen magnum. Skeleton: Mid cervical spondylosis. Other neck: No mass or lymphadenopathy. Upper chest: Normal Review of the MIP images confirms the above findings CTA HEAD FINDINGS Anterior circulation: Left internal carotid artery widely patent through the skull base and siphon region. Normal filling of the left anterior and middle cerebral branches. Right supraclinoid ICA occlusion at the carotid terminus with occlusion of the right middle cerebral artery. Stenosis of the A1 segment indicating that some clot extends into that vessel, but there is more distal filling probably due 2 some coexistent flow as well as a patent anterior  communicating artery. One can appreciate markedly diminished flow throughout the right middle cerebral artery territory. Posterior circulation: Both vertebral arteries widely patent to the basilar. No basilar stenosis. Posterior circulation branch vessels are normal. Large posterior communicating artery on the right. Venous sinuses: Patent and normal. Anatomic variants: None significant. Review of the MIP images confirms the above findings IMPRESSION: Embolic occlusion at the right carotid terminus. Right MCA occlusion. Severe stenosis of the right A1 segment but with distal flow. Markedly diminished flow evident throughout the right middle cerebral artery territory. No evidence of hemorrhage or swelling at this point in time. Carotid bifurcations widely patent. Findings discussed with Dr. Otelia Limes during interpretation at 1234 hours. Electronically Signed   By: Paulina Fusi M.D.   On: 03/16/2020 12:39      HISTORY OF PRESENT ILLNESS Brin Ruggerio is an 59 y.o.  female who presented to the ED via EMS for assessment of acute onset left sided weakness. LKN was 1130 when she was out shopping 03/16/2020. She states that after leaving a store, she started to feel dizzy and then tried to walk back in, but noticed that her gait was unsteady and staggering. Bystanders helped her to sit down, then noted that she was unable to stay sitting up due to left sided weakness. She also had left sided facial droop and slurred speech.   EMS was called to the scene, where they noted severe left sided weakness, dysarthria and left facial droop. A Code Stroke was called and she was emergently transported to the [facility] ED. Vitals per EMS: BP 110/70, HR 77, CBG 102.    The patient was unaware of her weakness on arrival. She endorses feeling dizzy when she was found staggering at the store, but is not able to specify what type of dizziness it was. The patient denies any neurological symptoms, including no headache, no weakness (she  states this while clearly being plegic on the left), no sensory loss, no trouble talking/understanding speech, no paresthesias and no vision changes. She states that she has a PMHx of breast CA treated about 10 years ago, as well as a cyst "on the roof of my mouth" which was recently excised. STAT CT head was obtained, revealing a dense right MCA sign, but no other acute abnormality. IV tPA was given. She was taken to IR for mechanical thrombectomy.    HOSPITAL COURSE Ms. Taneka Espiritu is a 59 y.o. female with history of breast CA 10 years ago and recent cyst excised from the roof of her mouth presenting with acute onset left sided weakness, dizziness, unsteady gait, facial droop and slurred speech. The patient received IV t-PA Saturday 03/16/20 at 1245. Sent to IR for R MCA occlusion.    Stroke:  R MCA and small L posterior infarcts s/p tPA ands IR R MCA w/ TICI3 revascularization - embolic - source unknown, more likely AF, ? Hypercoagulability given family history of hyper homocystinemia Code Stroke CT Head - no acute abnormality. hyperdense right MCA.  ASPECTS is 10  CTA H&N - R ICA terminus internal capsule. R MCA occlusion. R A1 severe stenosis with distal flow. Decreased R MCA flow.  Cerebral angio TICI3 revascularization proximal R M1 with x1 pass mm solitaire X retrieve and contact aspiration and prox flow arrest  Post IR CT head - R insular infarct, ? R parietal and temporal lobe infarct too. MRI head - R caudate head and putamen infarct, insular and frontoparietal region too. Few scattered small L posterior temporal occipital  Lobe and L parietal white matter  2D Echo - EF 60-65%. No source of embolus   TEE neg for Endocarditis, + PFO LE dopplers neg for DVT implantable loop recorder placed to evaluate for atrial fibrillation as etiology of stroke 8/3 (Allred) Loyal Jacobson Virus 2 - negative LDL - 138 HgbA1c - 5.7 UDS - not never collected  Hypercoagulable labs negative thus far,  homocysteine pending  VTE prophylaxis - Lovenox 30 mg sq daily  No antithrombotic prior to admission, now on No antithrombotic aspirin and Plavix for 3 weeks followed by aspirin alone  Therapy recommendations:  No therapy needs  Disposition:  return home   Chronic Hypotension Home BP meds: none  Treated w/ Cleviprex post IR, now off Hypotension -> phenylephrine (now off). 1 Liter NS bolus with NS IV 75 cc / hr. 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 Possibly more dental surgery. On PCN as OP. Will continue. TEE neg for endocarditis.   DISCHARGE EXAM Blood pressure 103/69, pulse (!) 55, temperature 99.1 F (37.3 C), temperature source Oral, resp. rate 16, height  (1.702 m), weight 53 kg, SpO2 100 %. GENERAL: Pleasant middle-age Caucasian lady not in distress. HEENT: Neck is supple no trauma noted. ABDOMEN: soft  EXTREMITIES: No edema  BACK: Normal SKIN: Normal by inspection. MENTAL STATUS: Alert and oriented. Speech, language and cognition are generally intact. Judgment and insight normal.  CRANIAL NERVES: Pupils are equal, round and reactive to light and accomodation; extra ocular movements are full, there is no significant nystagmus; visual fields are full; upper and lower facial muscles are normal in strength and symmetric, there is no flattening of the nasolabial folds; tongue is midline; uvula is midline; shoulder elevation is normal. MOTOR: Normal tone, bulk and strength; no pronator  drift. COORDINATION: Left finger to nose is normal, right finger to nose is normal, No rest tremor; no intention tremor; no postural tremor; no bradykinesia. SENSATION: Normal to light touch, temperature, and pain.  There is extinction on double simultaneous stimulation on the left side including the upper and lower extremities. NIH stroke scale score is 0  Discharge Diet   Regular thin liquids  DISCHARGE PLAN Disposition:  Return home aspirin 81 mg daily and clopidogrel 75 mg daily for secondary stroke prevention for 3 weeks then aspirin alone. Ongoing stroke risk factor control by Primary Care Physician at time of discharge Follow-up PCP Patient, No Pcp Per in 2 weeks. Follow-up in [facility] in 4 weeks, office to schedule an appointment.  Follow-up Sanjeev Alinda Money) Corliss Skains, MD (Interventional Neuroradiologist) in 4 weeks, office to schedule an appointment.  Follow-up [facility] for implantable loop recorder wound check in 10-14 days, office to schedule an appointment.  Loop recorder to be monitored by [facility] for atrial fibrillation as source of stroke. If found, they will notify patient. Outpatient referral to interventional cardiology for PFO closure  35 minutes were spent preparing discharge.  Annie Main, MSN, APRN, ANVP-BC, AGPCNP-BC Advanced Practice Stroke Nurse [facility] See Amion for Schedule & Pager information 03/19/2020 3:30 PM   I have personally obtained history,examined this patient, reviewed notes, independently viewed imaging studies, participated in medical decision making and plan of care.ROS completed by me personally and pertinent positives fully documented  I have made any additions or clarifications directly to the above note. Agree with note above.    Delia Heady, MD Medical Director [facility] Pager: [phone] 03/19/2020 4:13 PM

## 2020-03-19 NOTE — Transfer of Care (Signed)
Immediate Anesthesia Transfer of Care Note  Patient: Chelsea Trevino  Procedure(s) Performed: TRANSESOPHAGEAL ECHOCARDIOGRAM (TEE) (N/A ) BUBBLE STUDY  Patient Location: Endoscopy Unit  Anesthesia Type:MAC  Level of Consciousness: awake, alert  and oriented  Airway & Oxygen Therapy: Patient Spontanous Breathing and Patient connected to nasal cannula oxygen  Post-op Assessment: Report given to RN, Post -op Vital signs reviewed and stable and Patient moving all extremities  Post vital signs: Reviewed and stable  Last Vitals:  Vitals Value Taken Time  BP 96/72 03/19/20 1036  Temp    Pulse 63 03/19/20 1042  Resp 23 03/19/20 1042  SpO2 100 % 03/19/20 1042  Vitals shown include unvalidated device data.  Last Pain:  Vitals:   03/19/20 1036  TempSrc:   PainSc: 0-No pain      Patients Stated Pain Goal: 3 (67/34/19 3790)  Complications: No complications documented.

## 2020-03-19 NOTE — Interval H&P Note (Signed)
History and Physical Interval Note:  03/19/2020 2:47 PM  Chelsea Trevino  has presented today for surgery, with the diagnosis of stroke.  The various methods of treatment have been discussed with the patient and family. After consideration of risks, benefits and other options for treatment, the patient has consented to  Procedure(s): LOOP RECORDER INSERTION (N/A) as a surgical intervention.  The patient's history has been reviewed, patient examined, no change in status, stable for surgery.  I have reviewed the patient's chart and labs.  Questions were answered to the patient's satisfaction.     Hillis Range

## 2020-03-19 NOTE — CV Procedure (Addendum)
Brief TEE Note  LVEF 60-65% Mild MR, Mild TR No LA/LAA thrombus or mass +PFO with right to left flow at rest.  This was noted by color flow Doppler and saline microcavitation study. Will order lower extremity Dopplers to rule out DVT.  For additional details see full report.  Jaykob Minichiello C. Duke Salvia, MD, [facility] 03/19/2020 10:31 AM

## 2020-03-19 NOTE — Progress Notes (Signed)
  Echocardiogram Echocardiogram Transesophageal with color, spectra, and 3D has been performed.  Leta Jungling M 03/19/2020, 10:48 AM

## 2020-03-19 NOTE — Anesthesia Postprocedure Evaluation (Signed)
Anesthesia Post Note  Patient: Milissa Fesperman  Procedure(s) Performed: TRANSESOPHAGEAL ECHOCARDIOGRAM (TEE) (N/A ) BUBBLE STUDY     Patient location during evaluation: Endoscopy Anesthesia Type: MAC Level of consciousness: awake and alert Pain management: pain level controlled Vital Signs Assessment: post-procedure vital signs reviewed and stable Respiratory status: spontaneous breathing, nonlabored ventilation and respiratory function stable Cardiovascular status: blood pressure returned to baseline and stable Postop Assessment: no apparent nausea or vomiting Anesthetic complications: no   No complications documented.  Last Vitals:  Vitals:   03/19/20 1055 03/19/20 1212  BP: 121/60 103/69  Pulse: (!) 53 (!) 55  Resp: 14 16  Temp:  37.3 C  SpO2: 100% 100%    Last Pain:  Vitals:   03/19/20 1212  TempSrc: Oral  PainSc:                  Lidia Collum

## 2020-03-19 NOTE — Progress Notes (Signed)
Lower extremity venous bilateral study completed.   See Cv Proc for preliminary results.   Willona Phariss  

## 2020-03-19 NOTE — Progress Notes (Signed)
NIR.  History of acute CVA s/p cerebral arteriogram with emergent mechanical thrombectomy of right MCA proximal M1 occlusion achieving a TICI 3 revascularization via right femoral approach 03/16/2020 by Dr. Corliss Skains.  Plan for outpatient clinic follow-up with Dr. Corliss Skains 1 month after discharge (order placed to facilitate this, our schedulers to call patient to set up this appointment).  Please call NIR with questions/concerns.   Waylan Boga Kimiyah Blick, PA-C 03/19/2020, 10:44 AM

## 2020-03-19 NOTE — Anesthesia Procedure Notes (Signed)
Procedure Name: MAC Date/Time: 03/19/2020 10:11 AM Performed by: Leonor Liv, CRNA Pre-anesthesia Checklist: Patient identified, Emergency Drugs available, Suction available, Patient being monitored and Timeout performed Patient Re-evaluated:Patient Re-evaluated prior to induction Oxygen Delivery Method: Nasal cannula Placement Confirmation: positive ETCO2 Dental Injury: Teeth and Oropharynx as per pre-operative assessment

## 2020-03-19 NOTE — H&P (Signed)
Chelsea Trevino is a 59 y.o. female who has presented today for surgery, with the diagnosis of stroke.  The various methods of treatment have been discussed with the patient and family. After consideration of risks, benefits and other options for treatment, the patient has consented to  Procedure(s):  TRANSESOPHAGEAL ECHOCARDIOGRAM (TEE) (N/A) as a surgical intervention .  The patient's history has been reviewed, patient examined, no change in status, stable for surgery.  I have reviewed the patient's chart and labs.  Questions were answered to the patient's satisfaction.    Fitz Matsuo C. Duke Salvia, MD, [facility]  03/19/2020 10:01 AM

## 2020-03-19 NOTE — H&P (View-Only) (Signed)
ELECTROPHYSIOLOGY CONSULT NOTE  Patient ID: Chelsea Trevino MRN: [id number], DOB/AGE: September 28, 1960   Admit date: 03/16/2020 Date of Consult: 03/19/2020  Primary Physician: Patient, No Pcp Per Primary Cardiologist: none Reason for Consultation: Cryptogenic stroke ; recommendations regarding Implantable Loop Recorder, requested by Dr. 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  They first developed symptoms while shopping.   PMHx includes: breast cancer (10 years ago) treated with chemo and R mastectomy, otherwise no known PMHx  She is on antibiotics out patient for a recent dental/salivary gland procedure 03/07/20, she tells me the antibiotic was prophylaxis, not said to her to be infected WBC is normal and she is afebrile   Neurology notes: R MCA and small L posterior infarcts s/p tPA ands IR R MCA w/ TICI3 revascularization - embolic - source unknown, ? Endocarditis given recent dental infection ?  Hypercoagulability given family history of hyper homocystinemia.  she has undergone workup for stroke including echocardiogram and carotid angio.  The patient has been monitored on telemetry which has demonstrated sinus rhythm with no arrhythmias.  Inpatient stroke work-up is to be completed with a TEE.   Echocardiogram this admission demonstrated IMPRESSIONS  1. Left ventricular ejection fraction, by estimation, is 60 to 65%. The  left ventricle has normal function. The left ventricle has no regional  wall motion abnormalities. Left ventricular diastolic parameters were  normal.  2. Right ventricular systolic function is normal. The right ventricular  size is normal. There is mildly elevated pulmonary artery systolic  pressure. The estimated right ventricular systolic pressure is 41.0 mmHg.  3. The mitral valve is normal in structure.  Mild mitral valve  regurgitation. No evidence of mitral stenosis.  4. Tricuspid valve regurgitation is mild to moderate.  5. The aortic valve is normal in structure. Aortic valve regurgitation is  not visualized. No aortic stenosis is present.  6. The inferior vena cava is dilated in size with <50% respiratory  variability, suggesting right atrial pressure of 15 mmHg.      Lab work is reviewed.  Prior to admission, the patient denies chest pain, shortness of breath, dizziness, palpitations, or syncope.  They are recovering from their stroke with plans to home at discharge.     Surgical History: mastectomy   Medications Prior to Admission  Medication Sig Dispense Refill Last Dose  . penicillin v potassium (VEETID) 500 MG tablet Take 500 mg by mouth 4 (four) times daily.   03/16/2020 at Unknown time    Inpatient Medications:  .  stroke: mapping our early stages of recovery book   Does not apply Once  . aspirin EC  81 mg Oral Daily  . atorvastatin  80 mg Oral q1800  . Chlorhexidine Gluconate Cloth  6 each Topical Daily  . clopidogrel  75 mg Oral Daily  . enoxaparin (LOVENOX) injection  30 mg Subcutaneous Q24H  . pantoprazole (PROTONIX) IV  40 mg Intravenous QHS  . penicillin v potassium  500 mg Oral QID    Allergies:  Allergies  Allergen Reactions  . Codeine Other (See Comments)    Other Reaction: Nausea & Vomiting  . Latex Rash    Social History   Socioeconomic History  . Marital status: Married    Spouse name: Not on file  . Number of children: Not on file  . Years of education: Not  on file  . Highest education level: Not on file  Occupational History  . Not on file  Tobacco Use  . Smoking status: Not on file  Substance and Sexual Activity  . Alcohol use: Not on file  . Drug use: Not on file  . Sexual activity: Not on file  Other Topics Concern  . Not on file  Social History Narrative  . Not on file   Social Determinants of Health   Financial  Resource Strain:   . Difficulty of Paying Living Expenses:   Food Insecurity:   . Worried About Programme researcher, broadcasting/film/video in the Last Year:   . Barista in the Last Year:   Transportation Needs:   . Freight forwarder (Medical):   Marland Kitchen Lack of Transportation (Non-Medical):   Physical Activity:   . Days of Exercise per Week:   . Minutes of Exercise per Session:   Stress:   . Feeling of Stress :   Social Connections:   . Frequency of Communication with Friends and Family:   . Frequency of Social Gatherings with Friends and Family:   . Attends Religious Services:   . Active Member of Clubs or Organizations:   . Attends Banker Meetings:   Marland Kitchen Marital Status:   Intimate Partner Violence:   . Fear of Current or Ex-Partner:   . Emotionally Abused:   Marland Kitchen Physically Abused:   . Sexually Abused:      Mother : hypercoagulable history   Review of Systems: All other systems reviewed and are otherwise negative except as noted above.  Physical Exam: Vitals:   03/18/20 2107 03/18/20 2300 03/18/20 2333 03/19/20 0334  BP: 107/71 (!) 94/56  98/60  Pulse: 63 (!) 56 (!) 56 (!) 59  Resp: 18 18  17   Temp: 98 F (36.7 C) 98.7 F (37.1 C) 98.7 F (37.1 C) 98.1 F (36.7 C)  TempSrc: Oral Oral Oral Oral  SpO2: 96% 98%  96%  Weight: 45 kg     Height: 5\' 4"  (1.626 m)       GEN- The patient is well appearing, alert and oriented x 3 today.   Head- normocephalic, atraumatic Eyes-  Sclera clear, conjunctiva pink Ears- hearing intact Oropharynx- clear Neck- supple Lungs- CTA b/l, normal work of breathing Heart- RRR, no murmurs, rubs or gallops  GI- soft, NT, ND Extremities- no clubbing, cyanosis, or edema MS- no significant deformity or atrophy Skin- no rash or lesion (numerous tattoos) Psych- euthymic mood, full affect   Labs:   Lab Results  Component Value Date   WBC 9.2 03/17/2020   HGB 12.0 03/17/2020   HCT 36.5 03/17/2020   MCV 98.4 03/17/2020   PLT 252  03/17/2020    Recent Labs  Lab 03/16/20 1209 03/16/20 1213 03/17/20 0410  NA 140   < > 141  K 4.1   < > 4.1  CL 106   < > 111  CO2 24   < > 22  BUN 8   < > 7  CREATININE 0.72   < > 0.66  CALCIUM 9.4   < > 8.4*  PROT 5.7*  --   --   BILITOT 0.7  --   --   ALKPHOS 59  --   --   ALT 19  --   --   AST 17  --   --   GLUCOSE 94   < > 139*   < > = values in this  interval not displayed.   No results found for: CKTOTAL, CKMB, CKMBINDEX, TROPONINI Lab Results  Component Value Date   CHOL 205 (H) 03/17/2020   Lab Results  Component Value Date   HDL 56 03/17/2020   Lab Results  Component Value Date   LDLCALC 138 (H) 03/17/2020   Lab Results  Component Value Date   TRIG 54 03/17/2020   Lab Results  Component Value Date   CHOLHDL 3.7 03/17/2020   No results found for: LDLDIRECT  No results found for: DDIMER   Radiology/Studies:   CT HEAD WO CONTRAST Result Date: 03/16/2020 CLINICAL DATA:  Stroke, follow-up.  Follow-up CVA post intervention. EXAM: CT HEAD WITHOUT CONTRAST TECHNIQUE: Contiguous axial images were obtained from the base of the skull through the vertex without intravenous contrast. COMPARISON:  Noncontrast head CT and CT angiogram head/neck performed earlier the same day 03/16/2020. FINDINGS: Brain: Cerebral volume is normal. There is no evidence of acute intracranial hemorrhage. There is abnormal hypodensity consistent with acute infarction within the right insula. Subtle loss of gray-white differentiation is also questioned within portions of the right parietal and temporal lobes. No extra-axial fluid collection. No evidence of intracranial mass. No midline shift. Vascular: Residual circulating contrast material limits evaluation for hyperdense vessels. Skull: Normal. Negative for fracture or focal lesion. Sinuses/Orbits: Visualized orbits show no acute finding. Paranasal sinus mucosal thickening. Most notably, moderate mucosal thickening is present within the ethmoid  air cells. No significant mastoid effusion IMPRESSION: Acute ischemic infarction changes within the right insula. Subtle changes of acute ischemic infarction are also questioned within portions of the right parietal and temporal lobes. No evidence of acute intracranial hemorrhage. Residual circulating contrast material. Paranasal sinus disease as described. Electronically Signed   By: Jackey Loge DO   On: 03/16/2020 14:56     MR BRAIN WO CONTRAST Result Date: 03/17/2020 CLINICAL DATA:  Follow-up stroke. Left-sided weakness and facial droop. Right MCA embolus with subsequent intervention. EXAM: MRI HEAD WITHOUT CONTRAST TECHNIQUE: Multiplanar, multiecho pulse sequences of the brain and surrounding structures were obtained without intravenous contrast. COMPARISON:  CT studies done yesterday. FINDINGS: Brain: Diffusion imaging shows low level restricted diffusion along the gyral surfaces the insula and right frontoparietal brain. Acute infarction is evident in the caudate and putamen on the right. Few small separate foci of acute infarction in the right occipital and posterior parietal region. Minimal cortical swelling but no mass effect. No evidence of hemorrhage. Single punctate focus susceptibility artifact in the right posterior temporal subcortical white matter. No old ischemic changes are evident elsewhere. Vascular: Major vessels at the base of the brain show flow. Skull and upper cervical spine: Negative Sinuses/Orbits: Clear/normal Other: None IMPRESSION: Acute infarction affecting the caudate and putamen on the right. Lower level restricted diffusion affecting the cortical brain in the insula and frontoparietal region. Few other scattered small infarctions at the left posterior temporal/occipital junction and the left parietal white matter. No sign of hemorrhagic complication. No mass effect or shift. Electronically Signed   By: Paulina Fusi M.D.   On: 03/17/2020 11:22       CT ANGIO HEAD CODE  STROKE Result Date: 03/16/2020 CLINICAL DATA:  Acute left-sided weakness, slurred speech and facial droop. EXAM: CT ANGIOGRAPHY HEAD AND NECK TECHNIQUE: Multidetector CT imaging of the head and neck was performed using the standard protocol during bolus administration of intravenous contrast. Multiplanar CT image reconstructions and MIPs were obtained to evaluate the vascular anatomy. Carotid stenosis measurements (when applicable) are obtained utilizing  NASCET criteria, using the distal internal carotid diameter as the denominator. CONTRAST:  Not annotated COMPARISON:  Head CT earlier same day FINDINGS: CTA NECK FINDINGS Aortic arch: Mild atherosclerosis of the aortic arch. Branching pattern is normal without origin stenosis. Right carotid system: Common carotid artery widely patent to the carotid bifurcation. Carotid bifurcation is normal without soft or calcified plaque. Cervical ICA widely patent. Left carotid system: Left common carotid artery widely patent to the bifurcation. Carotid bifurcation is normal without soft or calcified plaque. Cervical ICA is normal. Vertebral arteries: Both vertebral artery origins widely patent. Both vertebral arteries widely patent through the foramen magnum. Skeleton: Mid cervical spondylosis. Other neck: No mass or lymphadenopathy. Upper chest: Normal Review of the MIP images confirms the above findings CTA HEAD FINDINGS Anterior circulation: Left internal carotid artery widely patent through the skull base and siphon region. Normal filling of the left anterior and middle cerebral branches. Right supraclinoid ICA occlusion at the carotid terminus with occlusion of the right middle cerebral artery. Stenosis of the A1 segment indicating that some clot extends into that vessel, but there is more distal filling probably due 2 some coexistent flow as well as a patent anterior communicating artery. One can appreciate markedly diminished flow throughout the right middle cerebral  artery territory. Posterior circulation: Both vertebral arteries widely patent to the basilar. No basilar stenosis. Posterior circulation branch vessels are normal. Large posterior communicating artery on the right. Venous sinuses: Patent and normal. Anatomic variants: None significant. Review of the MIP images confirms the above findings IMPRESSION: Embolic occlusion at the right carotid terminus. Right MCA occlusion. Severe stenosis of the right A1 segment but with distal flow. Markedly diminished flow evident throughout the right middle cerebral artery territory. No evidence of hemorrhage or swelling at this point in time. Carotid bifurcations widely patent. Findings discussed with Dr. Otelia LimesLindzen during interpretation at 1234 hours. Electronically Signed   By: Paulina FusiMark  Shogry M.D.   On: 03/16/2020 12:39      12-lead ECG none in Epic or shadow chart All prior EKG's in EPIC reviewed with no documented atrial fibrillation  Telemetry SB/SR, nocturnal brady early in her stay, no arrhythmias  Assessment and Plan:  1. Cryptogenic stroke The patient presents with cryptogenic stroke.  The patient has a TEE planned for this today.  I spoke at length with the patient about monitoring for afib with either a 30 day event monitor or an implantable loop recorder.  Risks, benefits, and alteratives to implantable loop recorder were discussed with the patient today.   At this time, the patient is very clear in her decision to proceed with implantable loop recorder.   Wound care was reviewed with the patient (keep incision clean and dry for 3 days).  Wound check will be scheduled for the patient   NOTE: There is no insurance listed for the patient, in d/w her she confirms that she has insurance and provided her card to someone here yesterday.  I have made a copy of her card and placed it into her chart and made charge RN aware to reach out to the correct department/people to get this in for her.  Please call with  questions.   Renee Norberto SorensonLynn Ursuy, PA-C 03/19/2020   I have seen, examined the patient, and reviewed the above assessment and plan.  Changes to above are made where necessary.  On exam, RRR.  The patient has had embolic stroke of unknown cause.  PFO on TEE however lower extremity dopplers were  negative. I agree with Dr Sethi that long term monitoring is indication to exclude atrial fibrillation.  Risks and benefits to ILR were discussed at length with the patient who wishes to proceed.  Co Sign: Esbeidy Mclaine, MD 03/19/2020 2:46 PM   

## 2020-03-19 NOTE — Consult Note (Addendum)
ELECTROPHYSIOLOGY CONSULT NOTE  Patient ID: Chelsea Trevino MRN: [id number], DOB/AGE: September 28, 1960   Admit date: 03/16/2020 Date of Consult: 03/19/2020  Primary Physician: Patient, No Pcp Per Primary Cardiologist: none Reason for Consultation: Cryptogenic stroke ; recommendations regarding Implantable Loop Recorder, requested by Dr. 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  They first developed symptoms while shopping.   PMHx includes: breast cancer (10 years ago) treated with chemo and R mastectomy, otherwise no known PMHx  She is on antibiotics out patient for a recent dental/salivary gland procedure 03/07/20, she tells me the antibiotic was prophylaxis, not said to her to be infected WBC is normal and she is afebrile   Neurology notes: R MCA and small L posterior infarcts s/p tPA ands IR R MCA w/ TICI3 revascularization - embolic - source unknown, ? Endocarditis given recent dental infection ?  Hypercoagulability given family history of hyper homocystinemia.  she has undergone workup for stroke including echocardiogram and carotid angio.  The patient has been monitored on telemetry which has demonstrated sinus rhythm with no arrhythmias.  Inpatient stroke work-up is to be completed with a TEE.   Echocardiogram this admission demonstrated IMPRESSIONS  1. Left ventricular ejection fraction, by estimation, is 60 to 65%. The  left ventricle has normal function. The left ventricle has no regional  wall motion abnormalities. Left ventricular diastolic parameters were  normal.  2. Right ventricular systolic function is normal. The right ventricular  size is normal. There is mildly elevated pulmonary artery systolic  pressure. The estimated right ventricular systolic pressure is 41.0 mmHg.  3. The mitral valve is normal in structure.  Mild mitral valve  regurgitation. No evidence of mitral stenosis.  4. Tricuspid valve regurgitation is mild to moderate.  5. The aortic valve is normal in structure. Aortic valve regurgitation is  not visualized. No aortic stenosis is present.  6. The inferior vena cava is dilated in size with <50% respiratory  variability, suggesting right atrial pressure of 15 mmHg.      Lab work is reviewed.  Prior to admission, the patient denies chest pain, shortness of breath, dizziness, palpitations, or syncope.  They are recovering from their stroke with plans to home at discharge.     Surgical History: mastectomy   Medications Prior to Admission  Medication Sig Dispense Refill Last Dose  . penicillin v potassium (VEETID) 500 MG tablet Take 500 mg by mouth 4 (four) times daily.   03/16/2020 at Unknown time    Inpatient Medications:  .  stroke: mapping our early stages of recovery book   Does not apply Once  . aspirin EC  81 mg Oral Daily  . atorvastatin  80 mg Oral q1800  . Chlorhexidine Gluconate Cloth  6 each Topical Daily  . clopidogrel  75 mg Oral Daily  . enoxaparin (LOVENOX) injection  30 mg Subcutaneous Q24H  . pantoprazole (PROTONIX) IV  40 mg Intravenous QHS  . penicillin v potassium  500 mg Oral QID    Allergies:  Allergies  Allergen Reactions  . Codeine Other (See Comments)    Other Reaction: Nausea & Vomiting  . Latex Rash    Social History   Socioeconomic History  . Marital status: Married    Spouse name: Not on file  . Number of children: Not on file  . Years of education: Not  on file  . Highest education level: Not on file  Occupational History  . Not on file  Tobacco Use  . Smoking status: Not on file  Substance and Sexual Activity  . Alcohol use: Not on file  . Drug use: Not on file  . Sexual activity: Not on file  Other Topics Concern  . Not on file  Social History Narrative  . Not on file   Social Determinants of Health   Financial  Resource Strain:   . Difficulty of Paying Living Expenses:   Food Insecurity:   . Worried About Programme researcher, broadcasting/film/video in the Last Year:   . Barista in the Last Year:   Transportation Needs:   . Freight forwarder (Medical):   Marland Kitchen Lack of Transportation (Non-Medical):   Physical Activity:   . Days of Exercise per Week:   . Minutes of Exercise per Session:   Stress:   . Feeling of Stress :   Social Connections:   . Frequency of Communication with Friends and Family:   . Frequency of Social Gatherings with Friends and Family:   . Attends Religious Services:   . Active Member of Clubs or Organizations:   . Attends Banker Meetings:   Marland Kitchen Marital Status:   Intimate Partner Violence:   . Fear of Current or Ex-Partner:   . Emotionally Abused:   Marland Kitchen Physically Abused:   . Sexually Abused:      Mother : hypercoagulable history   Review of Systems: All other systems reviewed and are otherwise negative except as noted above.  Physical Exam: Vitals:   03/18/20 2107 03/18/20 2300 03/18/20 2333 03/19/20 0334  BP: 107/71 (!) 94/56  98/60  Pulse: 63 (!) 56 (!) 56 (!) 59  Resp: 18 18  17   Temp: 98 F (36.7 C) 98.7 F (37.1 C) 98.7 F (37.1 C) 98.1 F (36.7 C)  TempSrc: Oral Oral Oral Oral  SpO2: 96% 98%  96%  Weight: 45 kg     Height: 5\' 4"  (1.626 m)       GEN- The patient is well appearing, alert and oriented x 3 today.   Head- normocephalic, atraumatic Eyes-  Sclera clear, conjunctiva pink Ears- hearing intact Oropharynx- clear Neck- supple Lungs- CTA b/l, normal work of breathing Heart- RRR, no murmurs, rubs or gallops  GI- soft, NT, ND Extremities- no clubbing, cyanosis, or edema MS- no significant deformity or atrophy Skin- no rash or lesion (numerous tattoos) Psych- euthymic mood, full affect   Labs:   Lab Results  Component Value Date   WBC 9.2 03/17/2020   HGB 12.0 03/17/2020   HCT 36.5 03/17/2020   MCV 98.4 03/17/2020   PLT 252  03/17/2020    Recent Labs  Lab 03/16/20 1209 03/16/20 1213 03/17/20 0410  NA 140   < > 141  K 4.1   < > 4.1  CL 106   < > 111  CO2 24   < > 22  BUN 8   < > 7  CREATININE 0.72   < > 0.66  CALCIUM 9.4   < > 8.4*  PROT 5.7*  --   --   BILITOT 0.7  --   --   ALKPHOS 59  --   --   ALT 19  --   --   AST 17  --   --   GLUCOSE 94   < > 139*   < > = values in this  interval not displayed.   No results found for: CKTOTAL, CKMB, CKMBINDEX, TROPONINI Lab Results  Component Value Date   CHOL 205 (H) 03/17/2020   Lab Results  Component Value Date   HDL 56 03/17/2020   Lab Results  Component Value Date   LDLCALC 138 (H) 03/17/2020   Lab Results  Component Value Date   TRIG 54 03/17/2020   Lab Results  Component Value Date   CHOLHDL 3.7 03/17/2020   No results found for: LDLDIRECT  No results found for: DDIMER   Radiology/Studies:   CT HEAD WO CONTRAST Result Date: 03/16/2020 CLINICAL DATA:  Stroke, follow-up.  Follow-up CVA post intervention. EXAM: CT HEAD WITHOUT CONTRAST TECHNIQUE: Contiguous axial images were obtained from the base of the skull through the vertex without intravenous contrast. COMPARISON:  Noncontrast head CT and CT angiogram head/neck performed earlier the same day 03/16/2020. FINDINGS: Brain: Cerebral volume is normal. There is no evidence of acute intracranial hemorrhage. There is abnormal hypodensity consistent with acute infarction within the right insula. Subtle loss of gray-white differentiation is also questioned within portions of the right parietal and temporal lobes. No extra-axial fluid collection. No evidence of intracranial mass. No midline shift. Vascular: Residual circulating contrast material limits evaluation for hyperdense vessels. Skull: Normal. Negative for fracture or focal lesion. Sinuses/Orbits: Visualized orbits show no acute finding. Paranasal sinus mucosal thickening. Most notably, moderate mucosal thickening is present within the ethmoid  air cells. No significant mastoid effusion IMPRESSION: Acute ischemic infarction changes within the right insula. Subtle changes of acute ischemic infarction are also questioned within portions of the right parietal and temporal lobes. No evidence of acute intracranial hemorrhage. Residual circulating contrast material. Paranasal sinus disease as described. Electronically Signed   By: Jackey Loge DO   On: 03/16/2020 14:56     MR BRAIN WO CONTRAST Result Date: 03/17/2020 CLINICAL DATA:  Follow-up stroke. Left-sided weakness and facial droop. Right MCA embolus with subsequent intervention. EXAM: MRI HEAD WITHOUT CONTRAST TECHNIQUE: Multiplanar, multiecho pulse sequences of the brain and surrounding structures were obtained without intravenous contrast. COMPARISON:  CT studies done yesterday. FINDINGS: Brain: Diffusion imaging shows low level restricted diffusion along the gyral surfaces the insula and right frontoparietal brain. Acute infarction is evident in the caudate and putamen on the right. Few small separate foci of acute infarction in the right occipital and posterior parietal region. Minimal cortical swelling but no mass effect. No evidence of hemorrhage. Single punctate focus susceptibility artifact in the right posterior temporal subcortical white matter. No old ischemic changes are evident elsewhere. Vascular: Major vessels at the base of the brain show flow. Skull and upper cervical spine: Negative Sinuses/Orbits: Clear/normal Other: None IMPRESSION: Acute infarction affecting the caudate and putamen on the right. Lower level restricted diffusion affecting the cortical brain in the insula and frontoparietal region. Few other scattered small infarctions at the left posterior temporal/occipital junction and the left parietal white matter. No sign of hemorrhagic complication. No mass effect or shift. Electronically Signed   By: Paulina Fusi M.D.   On: 03/17/2020 11:22       CT ANGIO HEAD CODE  STROKE Result Date: 03/16/2020 CLINICAL DATA:  Acute left-sided weakness, slurred speech and facial droop. EXAM: CT ANGIOGRAPHY HEAD AND NECK TECHNIQUE: Multidetector CT imaging of the head and neck was performed using the standard protocol during bolus administration of intravenous contrast. Multiplanar CT image reconstructions and MIPs were obtained to evaluate the vascular anatomy. Carotid stenosis measurements (when applicable) are obtained utilizing  NASCET criteria, using the distal internal carotid diameter as the denominator. CONTRAST:  Not annotated COMPARISON:  Head CT earlier same day FINDINGS: CTA NECK FINDINGS Aortic arch: Mild atherosclerosis of the aortic arch. Branching pattern is normal without origin stenosis. Right carotid system: Common carotid artery widely patent to the carotid bifurcation. Carotid bifurcation is normal without soft or calcified plaque. Cervical ICA widely patent. Left carotid system: Left common carotid artery widely patent to the bifurcation. Carotid bifurcation is normal without soft or calcified plaque. Cervical ICA is normal. Vertebral arteries: Both vertebral artery origins widely patent. Both vertebral arteries widely patent through the foramen magnum. Skeleton: Mid cervical spondylosis. Other neck: No mass or lymphadenopathy. Upper chest: Normal Review of the MIP images confirms the above findings CTA HEAD FINDINGS Anterior circulation: Left internal carotid artery widely patent through the skull base and siphon region. Normal filling of the left anterior and middle cerebral branches. Right supraclinoid ICA occlusion at the carotid terminus with occlusion of the right middle cerebral artery. Stenosis of the A1 segment indicating that some clot extends into that vessel, but there is more distal filling probably due 2 some coexistent flow as well as a patent anterior communicating artery. One can appreciate markedly diminished flow throughout the right middle cerebral  artery territory. Posterior circulation: Both vertebral arteries widely patent to the basilar. No basilar stenosis. Posterior circulation branch vessels are normal. Large posterior communicating artery on the right. Venous sinuses: Patent and normal. Anatomic variants: None significant. Review of the MIP images confirms the above findings IMPRESSION: Embolic occlusion at the right carotid terminus. Right MCA occlusion. Severe stenosis of the right A1 segment but with distal flow. Markedly diminished flow evident throughout the right middle cerebral artery territory. No evidence of hemorrhage or swelling at this point in time. Carotid bifurcations widely patent. Findings discussed with Dr. Otelia LimesLindzen during interpretation at 1234 hours. Electronically Signed   By: Paulina FusiMark  Shogry M.D.   On: 03/16/2020 12:39      12-lead ECG none in Epic or shadow chart All prior EKG's in EPIC reviewed with no documented atrial fibrillation  Telemetry SB/SR, nocturnal brady early in her stay, no arrhythmias  Assessment and Plan:  1. Cryptogenic stroke The patient presents with cryptogenic stroke.  The patient has a TEE planned for this today.  I spoke at length with the patient about monitoring for afib with either a 30 day event monitor or an implantable loop recorder.  Risks, benefits, and alteratives to implantable loop recorder were discussed with the patient today.   At this time, the patient is very clear in her decision to proceed with implantable loop recorder.   Wound care was reviewed with the patient (keep incision clean and dry for 3 days).  Wound check will be scheduled for the patient   NOTE: There is no insurance listed for the patient, in d/w her she confirms that she has insurance and provided her card to someone here yesterday.  I have made a copy of her card and placed it into her chart and made charge RN aware to reach out to the correct department/people to get this in for her.  Please call with  questions.   Renee Norberto SorensonLynn Ursuy, PA-C 03/19/2020   I have seen, examined the patient, and reviewed the above assessment and plan.  Changes to above are made where necessary.  On exam, RRR.  The patient has had embolic stroke of unknown cause.  PFO on TEE however lower extremity dopplers were  negative. I agree with Dr Pearlean Brownie that long term monitoring is indication to exclude atrial fibrillation.  Risks and benefits to ILR were discussed at length with the patient who wishes to proceed.  Co Sign: Hillis Range, MD 03/19/2020 2:46 PM

## 2020-03-19 NOTE — TOC Transition Note (Signed)
Transition of Care [facility]) - CM/SW Discharge Note   Patient Details  Name: Chelsea Trevino MRN: [id number] Date of Birth: 04/08/1961  [facility]) CM/SW Contact:  Kermit Balo, RN Phone Number: 03/19/2020, 4:26 PM   Clinical Narrative:    Pt discharging home with self care. No f/u per PT/OT and no DME needs.  Medications for home sent to [facility] pharmacy and CM provided the information her insurance company had for prescription meds. Per TOC it was not prescription coverage. Cost was $14 and pt able to pay.  Pt has transportation home and sister is in town and to provide supervision.   Final next level of care: Home/Self Care Barriers to Discharge: No Barriers Identified   Patient Goals and CMS Choice        Discharge Placement                       Discharge Plan and Services                                     Social Determinants of Health (SDOH) Interventions     Readmission Risk Interventions No flowsheet data found.

## 2020-03-20 ENCOUNTER — Telehealth: Payer: Self-pay | Admitting: Internal Medicine

## 2020-03-20 ENCOUNTER — Encounter (HOSPITAL_COMMUNITY): Payer: Self-pay | Admitting: Internal Medicine

## 2020-03-20 LAB — FACTOR 5 LEIDEN

## 2020-03-20 LAB — PROTHROMBIN GENE MUTATION

## 2020-03-20 LAB — PROTEIN C, TOTAL: Protein C, Total: 82 % (ref 60–150)

## 2020-03-20 NOTE — Telephone Encounter (Signed)
Patient is calling to inquire about when she is eligible to return to work and begin driving again after have loop recorder insertion. Please advise.

## 2020-03-20 NOTE — Telephone Encounter (Signed)
Follow up   Pt is calling back to follow up her inquiry earlier. She said she need to let her employer know when she can go back to work. She also said if nurse can message her to mychart instead of calling her.

## 2020-03-20 NOTE — Telephone Encounter (Signed)
Message sent to Dr.[place] for advice.

## 2020-03-20 NOTE — Telephone Encounter (Signed)
Loop recorder implant has no bearing on patient ability to drive.  The ability to drive should be assessed/ determined by managing MD.

## 2020-03-20 NOTE — Telephone Encounter (Signed)
Patient needs to call Dr Marlis Edelson office as Dr Duke Salvia was only involved doing her TEE  Left message to call back

## 2020-03-21 NOTE — Telephone Encounter (Signed)
Patient returning call.

## 2020-03-21 NOTE — Telephone Encounter (Signed)
Advised patient, verbalized understanding  

## 2020-03-25 ENCOUNTER — Telehealth (HOSPITAL_COMMUNITY): Payer: Self-pay

## 2020-03-25 NOTE — Telephone Encounter (Signed)
Called to schedule stroke f/u, no answer, left vm. AW  

## 2020-03-28 ENCOUNTER — Ambulatory Visit (INDEPENDENT_AMBULATORY_CARE_PROVIDER_SITE_OTHER): Payer: PRIVATE HEALTH INSURANCE | Admitting: Emergency Medicine

## 2020-03-28 ENCOUNTER — Other Ambulatory Visit: Payer: Self-pay

## 2020-03-28 DIAGNOSIS — I639 Cerebral infarction, unspecified: Secondary | ICD-10-CM

## 2020-03-28 LAB — CUP PACEART INCLINIC DEVICE CHECK
Date Time Interrogation Session: 20210812151456
Implantable Pulse Generator Implant Date: 20210803

## 2020-03-28 NOTE — Progress Notes (Signed)
ILR wound check in clinic. Steri strips removed prior to visit. Wound well healed. Home monitor transmitting nightly. No episodes. Questions answered. 

## 2020-04-05 NOTE — Progress Notes (Signed)
Kindly inform the patient that prothrombin gene mutation which is a test for abnormal familial clotting disorder was negative.

## 2020-04-08 ENCOUNTER — Encounter: Payer: Self-pay | Admitting: *Deleted

## 2020-04-16 ENCOUNTER — Encounter: Payer: Self-pay | Admitting: Adult Health

## 2020-04-16 ENCOUNTER — Ambulatory Visit (INDEPENDENT_AMBULATORY_CARE_PROVIDER_SITE_OTHER): Payer: PRIVATE HEALTH INSURANCE | Admitting: Adult Health

## 2020-04-16 ENCOUNTER — Other Ambulatory Visit: Payer: Self-pay

## 2020-04-16 ENCOUNTER — Ambulatory Visit (HOSPITAL_COMMUNITY)
Admission: RE | Admit: 2020-04-16 | Discharge: 2020-04-16 | Disposition: A | Discharge status: Home / Self Care | Payer: PRIVATE HEALTH INSURANCE | Source: Ambulatory Visit | Attending: Student | Admitting: Student

## 2020-04-16 VITALS — BP 107/69 | HR 97 | Ht 67.0 in | Wt 121.0 lb

## 2020-04-16 DIAGNOSIS — Q2112 Patent foramen ovale: Secondary | ICD-10-CM

## 2020-04-16 DIAGNOSIS — I639 Cerebral infarction, unspecified: Secondary | ICD-10-CM

## 2020-04-16 DIAGNOSIS — E785 Hyperlipidemia, unspecified: Secondary | ICD-10-CM | POA: Diagnosis not present

## 2020-04-16 DIAGNOSIS — Q211 Atrial septal defect: Secondary | ICD-10-CM

## 2020-04-16 MED ORDER — ATORVASTATIN CALCIUM 80 MG PO TABS: 80.0000 mg | ORAL_TABLET | Freq: Every day | ORAL | 3 refills | Status: AC

## 2020-04-16 NOTE — Patient Instructions (Addendum)
You have reocered well form a stroke standpoint. You are released to return back to work starting on 9/2  Continue aspirin 81 mg daily  and lipitor  for secondary stroke prevention We will check your cholesterol levels today to ensure your LDL or bad cholesterol has been decreasing   Continue to follow with IR for follow up and monitoring   Your loop recorder will continued to be monitored for possible atrial fibrillation  Continue to follow up with PCP regarding cholesterol and blood pressure management  Maintain strict control of hypertension with blood pressure goal below 130/90 and cholesterol with LDL cholesterol (bad cholesterol) goal below 70 mg/dL.        Followup in the future with me in 3 months or call earlier if needed       Thank you for coming to see [place] at [facility]. I hope we have been able to provide you high quality care today.  You may receive a patient satisfaction survey over the next few weeks. We would appreciate your feedback and comments so that we may continue to improve ourselves and the health of our patients.

## 2020-04-16 NOTE — Progress Notes (Signed)
I agree with the above plan 

## 2020-04-16 NOTE — Progress Notes (Signed)
[facility] [address] [place].  [address] [phone]       HOSPITAL FOLLOW UP NOTE  Chelsea Trevino Date of Birth:  Dec 17, 1960 Medical Record Number:  [id number]   Reason for Referral:  hospital stroke follow up    SUBJECTIVE:   CHIEF COMPLAINT:  Chief Complaint  Patient presents with  . Hospitalization Follow-up  . Cerebrovascular Accident    Pt said she is paranoid to the stroke.    HPI:   Stroke admission -personally reviewed pertinent progress notes, lab work and imaging with summary provided below ChelseaAnnely Voncannonis a 59 y.o.femalewith history of breast CA 10 years ago and recent cyst excised from the roof of her mouthwho presented on 03/16/2020 with acute onset left sided weakness, dizziness, unsteady gait, facial droop andslurred speech. Stroke work-up revealed right MCA and small left posterior infarcts s/p tPA and IR R MCA with TICI 3 revascularization, infarct embolic secondary to unknown source possibly A. Fib vs hypercoagulability given family history of hyper homocystinemia.  TEE positive for PFO. LE Doppler negative for DVT.  Hypercoagulable work-up negative.  Loop recorder placed to evaluate for atrial fibrillation as potential stroke etiology.  Recommended DAPT for 3 weeks and aspirin alone.  History of chronic hypotension but BP low but stable and recommended long-term BP goal normotensive range.  LDL 138 and initiate atorvastatin 80 mg daily.  Other stroke risk factors include advanced age but no prior stroke history.  Evaluated by therapy and was discharged home in stable condition without therapy needs.  Stroke: R MCA and small L posterior infarcts s/p tPA ands IR R MCA w/ TICI3 revascularization- embolic - source unknown, more likely AF, ?Hypercoagulability given family history of hyper homocystinemia  Code Stroke CT Head -no acute abnormality.hyperdense right MCA.ASPECTS is 10   CTA H&N- R ICAterminus  internal capsule. RMCA occlusion. R A1 severe stenosis with distal flow.Decreased R MCA flow.  Cerebral angio TICI3revascularizationproximalR M1 with x1 pass mm solitaire X retrieve and contact aspiration and prox flow arrest  Post IRCT head- R insular infarct, ? Rparietal and temporal lobe infarct too.  MRI head-R caudate head and putamen infarct, insular and frontoparietal region too. Few scattered small L posterior temporal occipital Lobe and L parietal white matter  2D Echo -EF60-65%. No source of embolus  TEE neg for Endocarditis, + PFO  LE dopplers neg for DVT  implantable loop recorder placed to evaluate for atrial fibrillation as etiology of stroke 8/3 ([facility])  Loyal Jacobson Virus 2 - negative  LDL - 138  HgbA1c- 5.7  UDS -not never collected  Hypercoagulable labsnegative  VTE prophylaxis - Lovenox 30 mg sq daily   No antithromboticprior to admission, now on No antithromboticaspirin and Plavix for 3 weeks followed by aspirin alone   Therapy recommendations:No therapy needs  Disposition: return home  Today, 04/16/2020, Chelsea Trevino is being seen for hospital follow-up  She recovered well without residual deficits She has very poor increased anxiety post stroke but does feel as though this has been slowing improving  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  PSH:  Past Surgical History:  Procedure Laterality Date  . BUBBLE STUDY  03/19/2020   Procedure: BUBBLE STUDY;   Surgeon: Chilton Si, MD;  Location: [facility] ENDOSCOPY;  Service: Cardiovascular;;  . IR PERCUTANEOUS ART THROMBECTOMY/INFUSION INTRACRANIAL INC DIAG ANGIO  03/16/2020  . LOOP RECORDER INSERTION N/A 03/19/2020   Procedure: LOOP RECORDER INSERTION;  Surgeon: Hillis Range, MD;  Location: [facility] INVASIVE CV LAB;  Service: Cardiovascular;  Laterality: N/A;  . RADIOLOGY WITH ANESTHESIA N/A 03/16/2020   Procedure: IR WITH ANESTHESIA;  Surgeon: Radiologist, Medication, MD;  Location: [facility] OR;  Service: Radiology;  Laterality: N/A;  . TEE WITHOUT CARDIOVERSION N/A 03/19/2020   Procedure: TRANSESOPHAGEAL ECHOCARDIOGRAM (TEE);  Surgeon: Chilton Si, MD;  Location: [facility] ENDOSCOPY;  Service: Cardiovascular;  Laterality: N/A;    Social History:  Social History   Socioeconomic History  . Marital status: Widowed    Spouse name: Not on file  . Number of children: Not on file  . Years of education: Not on file  . Highest education level: Not on file  Occupational History  . Not on file  Tobacco Use  . Smoking status: Current Every Day Smoker    Packs/day: 0.50    Types: Cigarettes  . Smokeless tobacco: Never Used  Vaping Use  . Vaping Use: Never used  Substance and Sexual Activity  . Alcohol use: Yes    Comment: occ  . Drug use: Never  . Sexual activity: Not on file  Other Topics Concern  . Not on file  Social History Narrative  . Not on file   Social Determinants of Health   Financial Resource Strain:   . Difficulty of Paying Living Expenses: Not on file  Food Insecurity:   . Worried About Programme researcher, broadcasting/film/video in the Last Year: Not on file  . Ran Out of Food in the Last Year: Not on file  Transportation Needs:   . Lack of Transportation (Medical): Not on file  . Lack of Transportation (Non-Medical): Not on file  Physical Activity:   . Days of Exercise per Week: Not on file  . Minutes of Exercise per Session: Not on file  Stress:   . Feeling of Stress : Not on file  Social Connections:     . Frequency of Communication with Friends and Family: Not on file  . Frequency of Social Gatherings with Friends and Family: Not on file  . Attends Religious Services: Not on file  . Active Member of Clubs or Organizations: Not on file  . Attends Banker Meetings: Not on file  . Marital Status: Not on file  Intimate Partner Violence:   . Fear of Current or Ex-Partner: Not on file  . Emotionally Abused: Not on file  . Physically Abused: Not on file  . Sexually Abused: Not on file    Family History: No family history on file.  Medications:   Current Outpatient Medications on File Prior to Visit  Medication Sig Dispense Refill  . aspirin EC 81 MG EC tablet Take 1 tablet (81 mg total) by mouth daily. Swallow whole. 30 tablet 11  . atorvastatin (LIPITOR) 80 MG tablet Take 1 tablet (80 mg total) by mouth daily at 6 PM. 30 tablet 2   No current facility-administered medications on file prior to visit.    Allergies:   Allergies  Allergen Reactions  . Codeine Other (See Comments)    Other Reaction: Nausea &  Vomiting  . Latex Rash      OBJECTIVE:  Physical Exam  Vitals:   04/16/20 0925  BP: 107/69  Pulse: 97  Weight: 121 lb (54.9 kg)  Height: 5\' 7"  (1.702 m)   Body mass index is 18.95 kg/m. No exam data present  No flowsheet data found.   General: well developed, well nourished, pleasant middle-age Caucasian female, seated, in no evident distress Head: head normocephalic and atraumatic.   Neck: supple with no carotid or supraclavicular bruits Cardiovascular: regular rate and rhythm, no murmurs Musculoskeletal: no deformity Skin:  no rash/petichiae Vascular:  Normal pulses all extremities   Neurologic Exam Mental Status: Awake and fully alert.   Fluent speech and language.  Oriented to place and time. Recent and remote memory intact. Attention span, concentration and fund of knowledge appropriate. Mood and affect appropriate.  Cranial Nerves:  Fundoscopic exam reveals sharp disc margins. Pupils equal, briskly reactive to light. Extraocular movements full without nystagmus. Visual fields full to confrontation. Hearing intact. Facial sensation intact. Face, tongue, palate moves normally and symmetrically.  Motor: Normal bulk and tone. Normal strength in all tested extremity muscles. Sensory.: intact to touch , pinprick , position and vibratory sensation.  Coordination: Rapid alternating movements normal in all extremities. Finger-to-nose and heel-to-shin performed accurately bilaterally. Gait and Station: Arises from chair without difficulty. Stance is normal. Gait demonstrates normal stride length and balance Reflexes: 1+ and symmetric. Toes downgoing.     NIHSS  0 Modified Rankin  0 ROPE score 5 (34% chance that stroke is due to PFO)     ASSESSMENT: Chelsea Trevino is a 59 y.o. year old female presented with acute onset left-sided weakness, dizziness, unsteady gait, facial droop and slurred speech on 03/16/2020 with stroke work-up revealing right MCA as well as posterior infarcts in setting of right MCA occlusion s/p TPA and IR with TICI 3 revascularization, infarct embolic secondary to unknown source therefore loop recorder placed.  Hypercoagulable work-up negative (family history of hyper homocystinemia).  Vascular risk factors include HLD, PFO, chronic hypotension and advanced age.      PLAN:  1. Left MCA and posterior stroke, cryptogenic:  a. Recovered well without residual deficits.  She is cleared to return to work full-time starting on 9/2.   b. Loop recorder has not shown atrial fibrillation thus far.  Personally reviewed report.   c. Continue aspirin 81 mg daily  and atorvastatin for secondary stroke prevention.  d. Close PCP follow up for aggressive stroke risk factor management  e. Additional information provided by [facility] research team for possible participation in [place] trial 2. PFO:  a. Low risk PFO with ROPE  score 5.   b. No evidence of DVT.   c. Continue medical management as no indication for closure at this time 3. HLD:  a. LDL goal <70. Recent LDL 138.  b. Continue atorvastatin 80 mg daily - will repeat lipid panel c. F/u with PCP for management as well as prescribing of statin     Follow up in 3 months or call earlier if needed   I spent 45 minutes of face-to-face and non-face-to-face time with patient.  This included previsit chart review, lab review, study review, order entry, electronic health record documentation, patient education regarding recent stroke, ongoing monitoring of loop recorder, possible participation in [place] trial, evidence of PFO, importance of managing stroke risk factors and answered all questions to patient satisfaction   [place], AGNP-BC  [facility] [address]  [place], [place] [address]  Phone [phone] Fax [phone] Note: This document was prepared with digital dictation and possible smart phrase technology. Any transcriptional errors that result from this process are unintentional.

## 2020-04-17 ENCOUNTER — Encounter: Payer: Self-pay | Admitting: Adult Health

## 2020-04-17 LAB — LIPID PANEL
Chol/HDL Ratio: 2.2 ratio (ref 0.0–4.4)
Cholesterol, Total: 139 mg/dL (ref 100–199)
HDL: 62 mg/dL (ref >39)
LDL Chol Calc (NIH): 66 mg/dL (ref 0–99)
Triglycerides: 48 mg/dL (ref 0–149)
VLDL Cholesterol Cal: 11 mg/dL (ref 5–40)

## 2020-04-23 ENCOUNTER — Encounter: Payer: Self-pay | Admitting: Adult Health

## 2020-04-23 NOTE — Telephone Encounter (Signed)
Yes that is fine but she may need to list an MD due to insurance purposes - she can put down both Dr. Pearlean Brownie and myself.  Thank you.

## 2020-04-29 ENCOUNTER — Ambulatory Visit (INDEPENDENT_AMBULATORY_CARE_PROVIDER_SITE_OTHER): Payer: 59 | Admitting: *Deleted

## 2020-04-29 DIAGNOSIS — I639 Cerebral infarction, unspecified: Secondary | ICD-10-CM

## 2020-04-29 LAB — CUP PACEART REMOTE DEVICE CHECK
Date Time Interrogation Session: 20210910015007
Implantable Pulse Generator Implant Date: 20210803

## 2020-04-30 NOTE — Progress Notes (Signed)
Carelink Summary Report / Loop Recorder 

## 2020-05-29 LAB — CUP PACEART REMOTE DEVICE CHECK
Date Time Interrogation Session: 20211013015546
Implantable Pulse Generator Implant Date: 20210803

## 2020-05-30 ENCOUNTER — Ambulatory Visit (INDEPENDENT_AMBULATORY_CARE_PROVIDER_SITE_OTHER): Payer: 59

## 2020-05-30 DIAGNOSIS — I639 Cerebral infarction, unspecified: Secondary | ICD-10-CM

## 2020-06-05 NOTE — Progress Notes (Signed)
Carelink Summary Report / Loop Recorder 

## 2020-06-24 ENCOUNTER — Encounter: Payer: Self-pay | Admitting: Adult Health

## 2020-06-25 ENCOUNTER — Other Ambulatory Visit: Payer: Self-pay

## 2020-06-25 NOTE — Patient Outreach (Signed)
[facility]) Care Management  06/25/2020  Ladaysha Soutar [birth year] [id number]  First telephone outreach attempt to obtain mRS. No answer. Left message for returned call.  [name] under supervision of [employer] [facility] Management Assistant [phone]

## 2020-06-28 ENCOUNTER — Other Ambulatory Visit: Payer: Self-pay

## 2020-06-28 NOTE — Patient Outreach (Signed)
[facility]) Care Management  06/28/2020  Chelsea Trevino 07/22/1961 [id number]   Second telephone outreach attempt to obtain mRS. No answer. Left message for returned call. Called all the working number listed in the chart.  [name] under supervision of [employer] [facility] Management Assistant [phone]

## 2020-06-28 NOTE — Patient Outreach (Signed)
[facility]) Care Management  06/28/2020  Chelsea Trevino June 19, 1961 [id number]   Telephone outreach to patient to obtain mRS was successfully completed. MRS= 1.  Domingo Cocking [facility] Care Management Assistant [phone]

## 2020-07-01 ENCOUNTER — Ambulatory Visit (INDEPENDENT_AMBULATORY_CARE_PROVIDER_SITE_OTHER): Payer: PRIVATE HEALTH INSURANCE

## 2020-07-01 DIAGNOSIS — I639 Cerebral infarction, unspecified: Secondary | ICD-10-CM | POA: Diagnosis not present

## 2020-07-01 LAB — CUP PACEART REMOTE DEVICE CHECK
Date Time Interrogation Session: 20211115015604
Implantable Pulse Generator Implant Date: 20210803

## 2020-07-03 NOTE — Progress Notes (Signed)
Carelink Summary Report / Loop Recorder 

## 2020-08-05 ENCOUNTER — Ambulatory Visit (INDEPENDENT_AMBULATORY_CARE_PROVIDER_SITE_OTHER): Payer: 59

## 2020-08-05 DIAGNOSIS — I639 Cerebral infarction, unspecified: Secondary | ICD-10-CM

## 2020-08-05 LAB — CUP PACEART REMOTE DEVICE CHECK
Date Time Interrogation Session: 20211218015506
Implantable Pulse Generator Implant Date: 20210803

## 2020-08-15 NOTE — Progress Notes (Signed)
Carelink Summary Report / Loop Recorder 

## 2020-08-19 ENCOUNTER — Ambulatory Visit: Payer: PRIVATE HEALTH INSURANCE | Admitting: Adult Health

## 2020-09-01 ENCOUNTER — Encounter: Payer: Self-pay | Admitting: Adult Health

## 2020-09-05 LAB — CUP PACEART REMOTE DEVICE CHECK
Date Time Interrogation Session: 20220120015034
Implantable Pulse Generator Implant Date: 20210803

## 2020-09-09 ENCOUNTER — Ambulatory Visit (INDEPENDENT_AMBULATORY_CARE_PROVIDER_SITE_OTHER): Payer: 59

## 2020-09-09 DIAGNOSIS — I639 Cerebral infarction, unspecified: Secondary | ICD-10-CM | POA: Diagnosis not present

## 2020-09-19 NOTE — Progress Notes (Signed)
Carelink Summary Report / Loop Recorder 

## 2020-10-12 LAB — CUP PACEART REMOTE DEVICE CHECK
Date Time Interrogation Session: 20220222015149
Implantable Pulse Generator Implant Date: 20210803

## 2020-10-14 ENCOUNTER — Ambulatory Visit (INDEPENDENT_AMBULATORY_CARE_PROVIDER_SITE_OTHER): Payer: PRIVATE HEALTH INSURANCE

## 2020-10-14 DIAGNOSIS — I639 Cerebral infarction, unspecified: Secondary | ICD-10-CM

## 2020-10-21 NOTE — Progress Notes (Signed)
Carelink Summary Report / Loop Recorder 

## 2020-11-10 LAB — CUP PACEART REMOTE DEVICE CHECK
Date Time Interrogation Session: 20220327015415
Implantable Pulse Generator Implant Date: 20210803

## 2020-11-11 ENCOUNTER — Ambulatory Visit (INDEPENDENT_AMBULATORY_CARE_PROVIDER_SITE_OTHER): Payer: PRIVATE HEALTH INSURANCE

## 2020-11-11 DIAGNOSIS — I639 Cerebral infarction, unspecified: Secondary | ICD-10-CM

## 2020-11-18 ENCOUNTER — Encounter: Payer: Self-pay | Admitting: Adult Health

## 2020-11-25 NOTE — Progress Notes (Signed)
Carelink Summary Report / Loop Recorder 

## 2020-11-26 NOTE — Addendum Note (Signed)
Addended by: Geralyn Flash D on: 11/26/2020 03:28 PM   Modules accepted: Level of Service

## 2021-01-15 ENCOUNTER — Telehealth: Payer: Self-pay | Admitting: Adult Health

## 2021-01-15 NOTE — Telephone Encounter (Signed)
It will be coming up on a year since implant -it is highly recommended to continue at least 2 to 3-year monitoring but I understand if there are financial difficulties.  I would advise her to contact the customer service number for the loop recorder or her insurance in regards to the payment cost - I have never seen it be that much per month. If she still wishes to have device removed, she can contact cardiology to further discuss. Thank you

## 2021-01-15 NOTE — Telephone Encounter (Signed)
Pt called, would like to terminate loop heart monitoring. I can no longer afford the $300 month. Would like a call from the nurse.

## 2021-01-16 NOTE — Telephone Encounter (Signed)
Contacted pt to FU regarding her loop monitor. I informed her that it will be coming up on a year since implant. Shanda Bumps highly recommended to continue at least 2 to 3-year monitoring but we understand if there are financial difficulties. Shanda Bumps advise her to contact the customer service number for the loop recorder or her insurance in regards to the payment cost.  If she still wishes to have device removed, she can contact cardiology to further discuss. I gave her the number to [facility] for further discussion. She understood and advised to let me know if I can do anything else.
